# Patient Record
Sex: Female | Born: 2000 | Race: White | Hispanic: No | Smoking: Never smoker
Health system: Southern US, Community
[De-identification: ages and names within clinical notes are randomized; demographics above are authoritative.]

## PROBLEM LIST (undated history)

## (undated) DIAGNOSIS — J45909 Unspecified asthma, uncomplicated: Secondary | ICD-10-CM

## (undated) DIAGNOSIS — F909 Attention-deficit hyperactivity disorder, unspecified type: Secondary | ICD-10-CM

## (undated) DIAGNOSIS — R519 Headache, unspecified: Secondary | ICD-10-CM

## (undated) DIAGNOSIS — F429 Obsessive-compulsive disorder, unspecified: Secondary | ICD-10-CM

## (undated) DIAGNOSIS — H6980 Other specified disorders of Eustachian tube, unspecified ear: Secondary | ICD-10-CM

## (undated) DIAGNOSIS — F913 Oppositional defiant disorder: Secondary | ICD-10-CM

## (undated) DIAGNOSIS — F419 Anxiety disorder, unspecified: Secondary | ICD-10-CM

## (undated) HISTORY — DX: Obsessive-compulsive disorder, unspecified: F42.9

## (undated) HISTORY — DX: Attention-deficit hyperactivity disorder, unspecified type: F90.9

## (undated) HISTORY — DX: Oppositional defiant disorder: F91.3

## (undated) HISTORY — DX: Other specified disorders of Eustachian tube, unspecified ear: H69.80

## (undated) HISTORY — DX: Unspecified asthma, uncomplicated: J45.909

## (undated) HISTORY — PX: TYMPANOSTOMY TUBE PLACEMENT: SHX32

---
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
## 2022-01-03 ENCOUNTER — Ambulatory Visit (HOSPITAL_COMMUNITY): Admission: RE | Admit: 2022-01-03 | Payer: Medicaid Other | Source: Ambulatory Visit

## 2022-01-03 ENCOUNTER — Encounter: Payer: Medicaid Other | Admitting: Adult Health

## 2022-01-05 NOTE — Congregational Nurse Program (Signed)
?  Dept: [phone] ? ? ?Congregational Nurse Program Note ? ?Date of Encounter: 01/05/2022 ? ?Past Medical History: ?Past Medical History:  ?Diagnosis Date  ? ADHD (attention deficit hyperactivity disorder)   ? Anxiety   ? Eustachian tube dysfunction 11/11  ? Headache   ? OCD (obsessive compulsive disorder)   ? mild   ? ODD (oppositional defiant disorder)   ? some   ? ? ?Encounter Details: ?No complaints Stated she saw her  PCP and was told to watch her intake of sugar because her blood glucose  was elevated at the time of her visit. BP 120/80  P 78 Random blood sugar 118. Jenene Slicker RN ? ? ? ?

## 2022-01-07 ENCOUNTER — Encounter: Payer: Medicaid Other | Admitting: Obstetrics & Gynecology

## 2022-01-07 ENCOUNTER — Encounter: Payer: Medicaid Other | Admitting: Adult Health

## 2022-04-07 IMAGING — US US ABDOMEN LIMITED
1 series · 14 of 25 positions shown · non-contrast
Comparison: CT abdomen done on 06/21/2021

CLINICAL DATA: Pain right upper quadrant, nausea, vomiting

EXAM:
ULTRASOUND ABDOMEN LIMITED RIGHT UPPER QUADRANT

[Series 1: us abdomen limited · 14 of 75 slices shown]
[im 1/75]
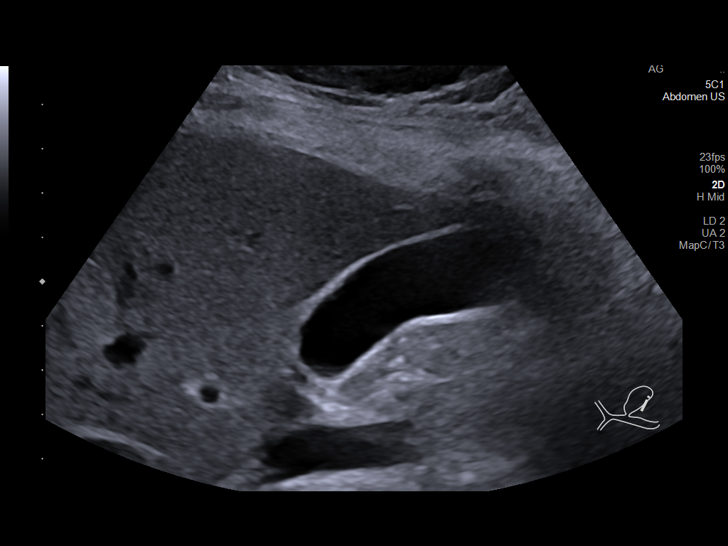
[im 7/75]
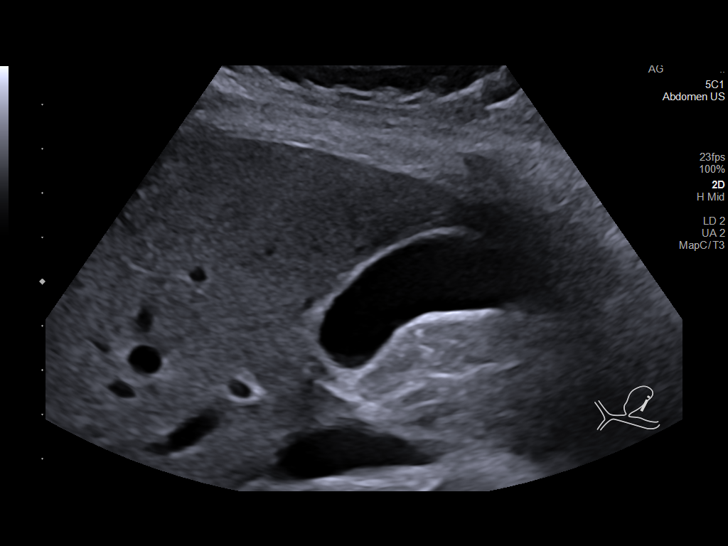
[im 13/75]
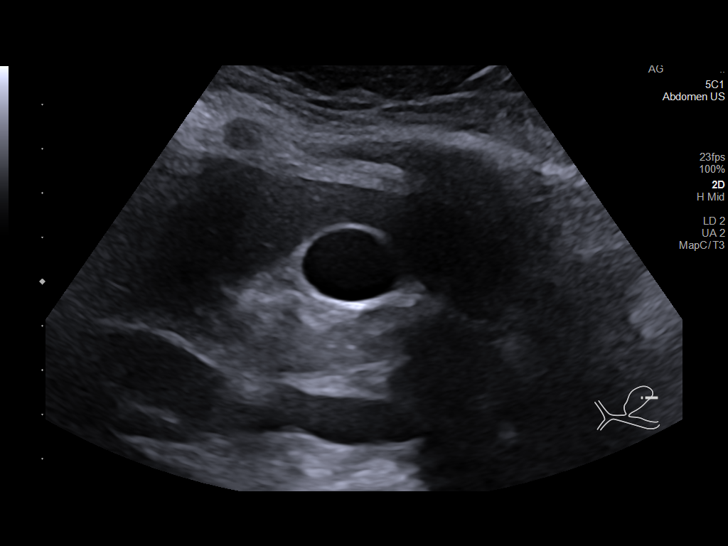
[im 19/75]
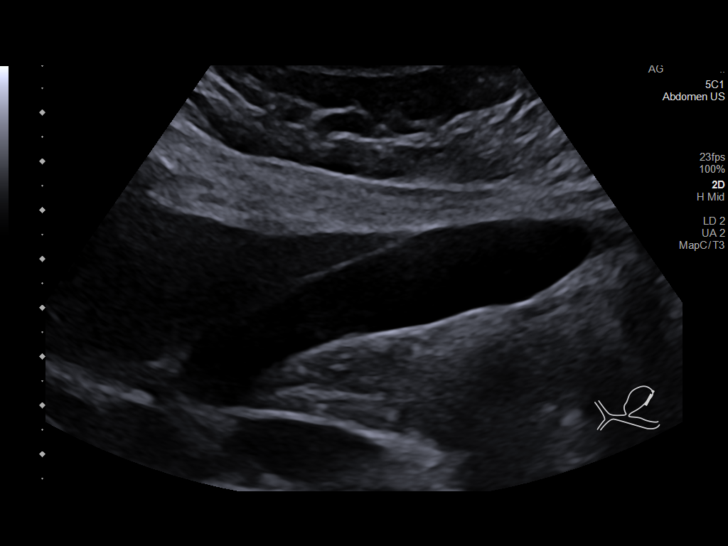
[im 25/75]
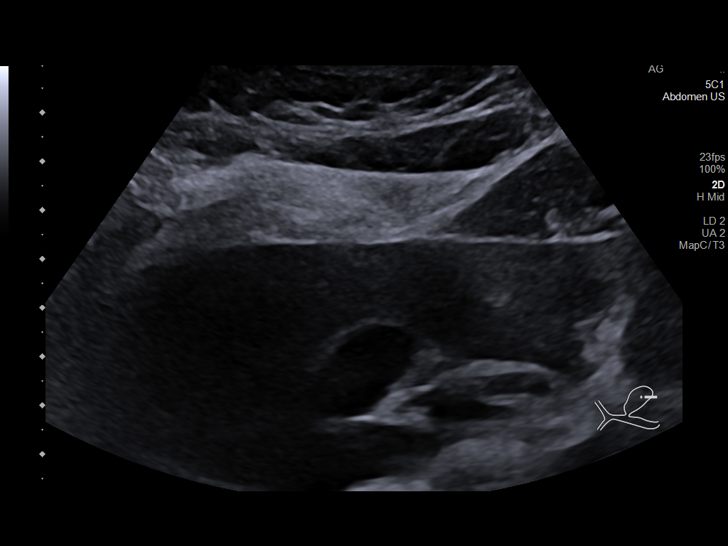
[im 28/75]
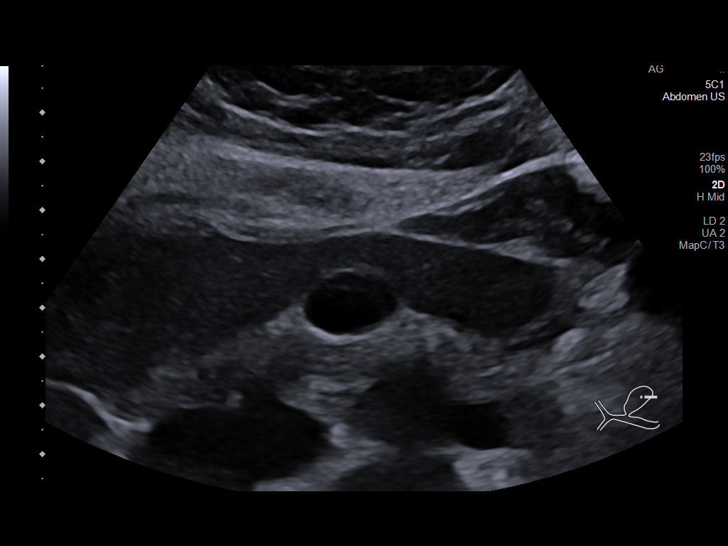
[im 34/75]
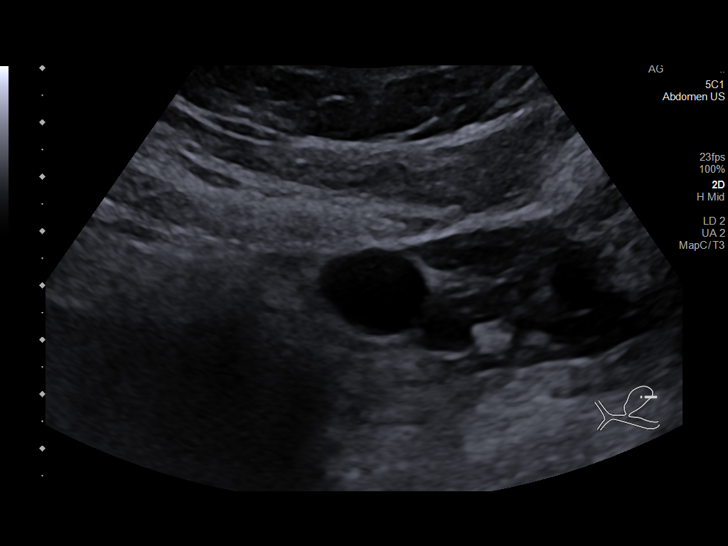
[im 41/75]
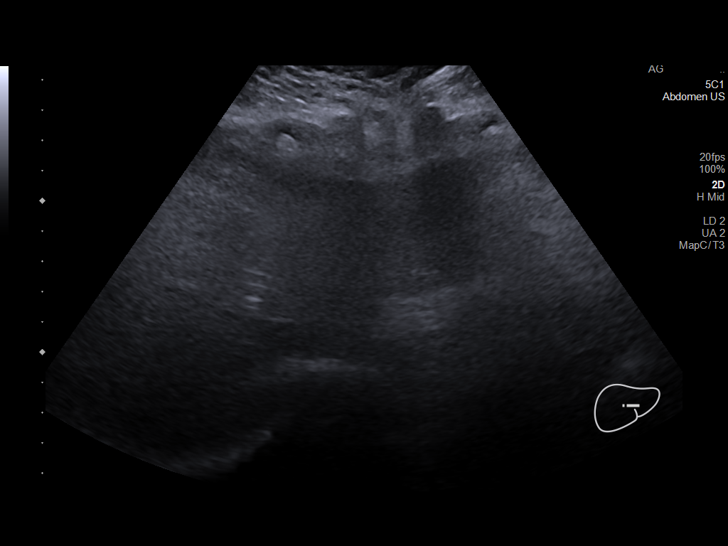
[im 47/75]
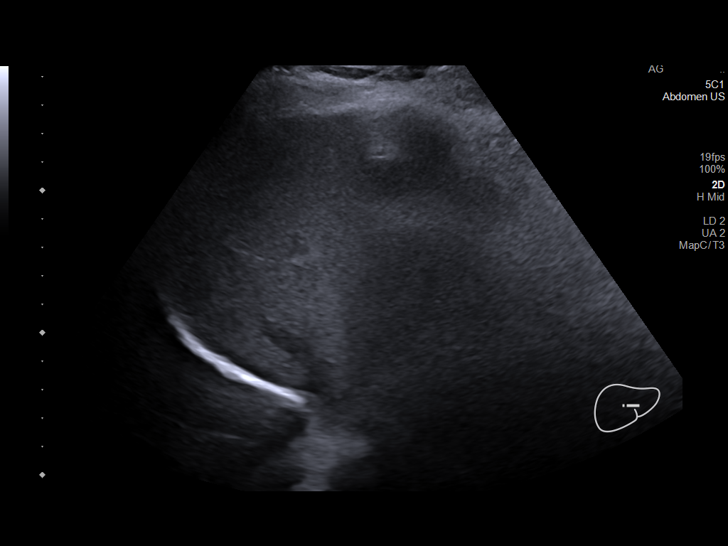
[im 50/75]
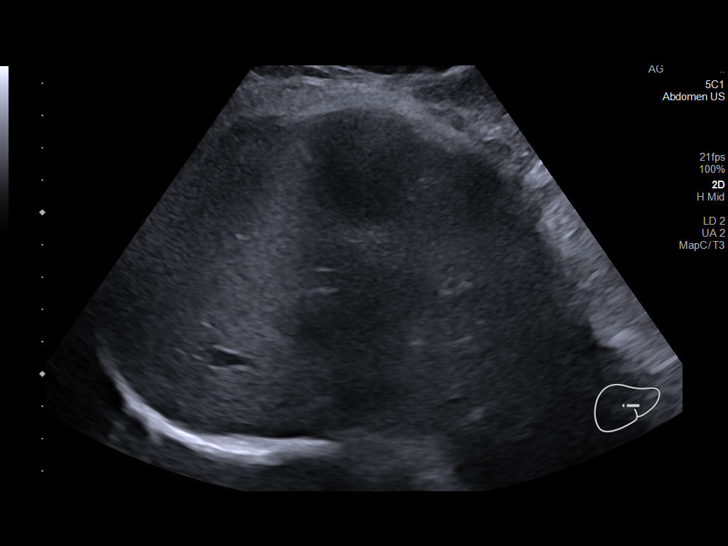
[im 56/75]
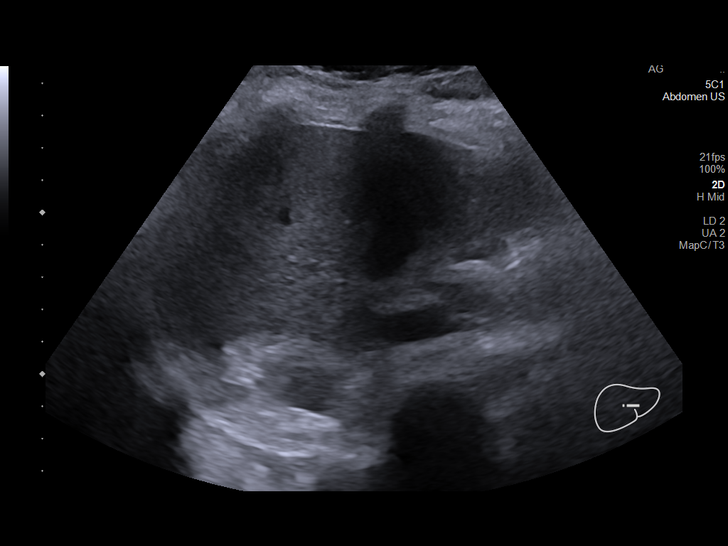
[im 62/75]
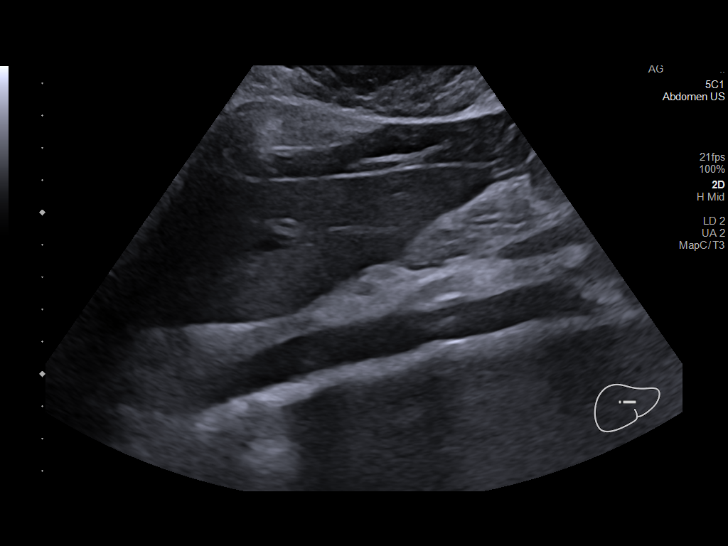
[im 68/75]
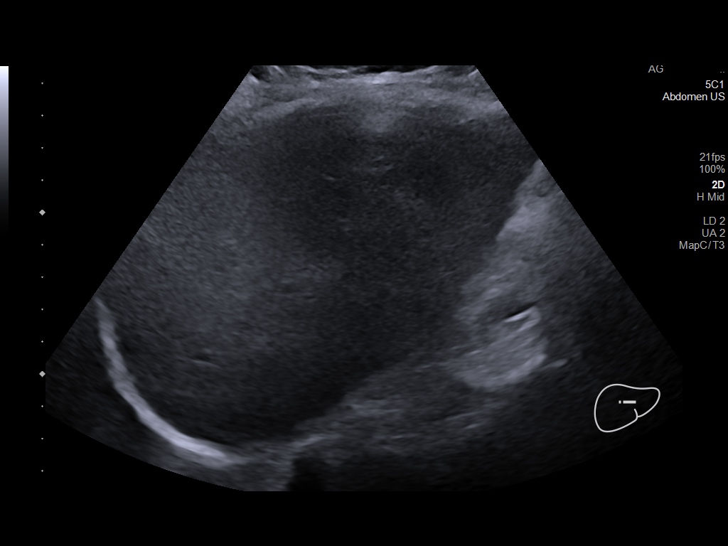
[im 75/75]
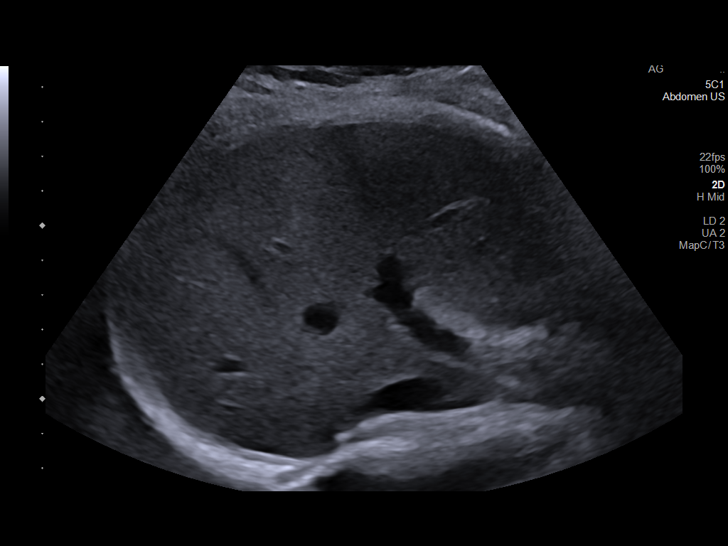

[14 of 25 positions shown; findings below may reference images not displayed]

FINDINGS: Gallbladder:

No gallstones or wall thickening visualized. No sonographic Murphy
sign noted by sonographer.

Common bile duct:

Diameter: 2 mm

Liver:

No focal lesion identified. Within normal limits in parenchymal
echogenicity. Portal vein is patent on color Doppler imaging with
normal direction of blood flow towards the liver.

Other: None.
IMPRESSION: No sonographic abnormality is seen in the right upper quadrant of
abdomen.

## 2022-04-10 ENCOUNTER — Ambulatory Visit (INDEPENDENT_AMBULATORY_CARE_PROVIDER_SITE_OTHER): Payer: Medicaid Other | Admitting: Nurse Practitioner

## 2022-04-10 ENCOUNTER — Encounter: Payer: Self-pay | Admitting: Nurse Practitioner

## 2022-04-10 VITALS — BP 129/88 | HR 97 | Temp 97.9°F | Resp 20 | Ht 68.0 in | Wt 147.0 lb

## 2022-04-10 DIAGNOSIS — F3481 Disruptive mood dysregulation disorder: Secondary | ICD-10-CM | POA: Diagnosis not present

## 2022-04-10 DIAGNOSIS — K219 Gastro-esophageal reflux disease without esophagitis: Secondary | ICD-10-CM | POA: Diagnosis not present

## 2022-04-10 DIAGNOSIS — N6452 Nipple discharge: Secondary | ICD-10-CM | POA: Diagnosis not present

## 2022-04-10 DIAGNOSIS — R7989 Other specified abnormal findings of blood chemistry: Secondary | ICD-10-CM

## 2022-04-10 DIAGNOSIS — Z3042 Encounter for surveillance of injectable contraceptive: Secondary | ICD-10-CM

## 2022-04-10 LAB — PREGNANCY, URINE: Preg Test, Ur: NEGATIVE

## 2022-04-10 MED ORDER — OMEPRAZOLE 20 MG PO CPDR: 20.0000 mg | DELAYED_RELEASE_CAPSULE | Freq: Every day | ORAL | 3 refills | Status: DC

## 2022-04-10 MED ORDER — MEDROXYPROGESTERONE ACETATE 150 MG/ML IM SUSP
150.0000 mg | Freq: Once | INTRAMUSCULAR | Status: AC
  Administered 2022-04-10: 150 mg via INTRAMUSCULAR

## 2022-04-10 NOTE — Addendum Note (Signed)
Addended by: Cleda Daub on: 04/10/2022 02:07 PM   Modules accepted: Orders

## 2022-04-10 NOTE — Patient Instructions (Signed)
Disruptive Mood Dysregulation Disorder, Pediatric Disruptive mood dysregulation disorder (DMDD) is a mental health disorder that affects children and adolescents who are 19-19 years of age. A child with this disorder regularly has severe temper outbursts that affect daily life. These outbursts are much more severe than what might be expected for the situation and the child's age. What are the causes? The cause of this condition is not known. What increases the risk? Your child may be more likely to develop this condition if he or she: Has a long-standing history of irritability or anger. Was previously diagnosed with bipolar disorder but did not meet all the criteria for a bipolar disorder diagnosis. Started having symptoms of irritability and temper outbursts before the age of 19. What are the signs or symptoms? Symptoms of this condition include: Severe temper outbursts that are extreme for the situation. Severe temper outbursts that happen three or more times a week for one year or longer. An angry or irritable mood between temper outbursts. An angry, sad, or irritable mood nearly every day. Trouble with daily living because of anger or irritability. How is this diagnosed? This condition may be diagnosed based on your child's symptoms. The health care provider will assess how severe the symptoms are and how long they have lasted. To be diagnosed with this condition, your child must have shown the symptoms for one year or longer. Your child may be referred to a child therapist to confirm the diagnosis and begin treatment. How is this treated? This condition may be treated with: Cognitive behavioral therapy. This is a form of talk therapy that can help your child learn coping skills to identify and regulate his or her moods and feelings. Medicines such as antidepressants, stimulants, or antipsychotics. Parent training to help you learn to manage your child's behavior at home and decrease  outbursts. Working with your Therapist, art or school. This may involve developing an educational plan to address behavioral and emotional challenges. Computer-based programs that help your child learn how to accurately read facial expressions. This skill can help prevent your child from misreading other people's faces in ways that can affect his or her mood. Dialectical Behavioral Training for children, (DBT-C) This form of therapy helps your child develop methods to identify intense arousal and practice self calming techniques. Your child's health care provider will create a treatment plan just for your child. Follow these instructions at home:  Keep a journal to track of your child's moods and outbursts and provide these records to your child's health care provider. Try to document what happens before, during, and after an outburst. Learn as much as you can about the disorder. Ask questions when you visit your child's health care provider. Learn about the risks and benefits of different treatments. Follow any parent training you receive. This may include ways to respond to irritable behavior and how to avoid or predict angry outbursts from your child. Contact a health care provider if: Your child's symptoms do not improve or they get worse. Your child's behavior is affecting daily life at home or at school. Get help right away if: Your child's outbursts may harm someone or your child. If you ever feel like your child may hurt himself or herself or others, or he or she shares thoughts about taking his or her own life, get help right away. You can go to your nearest emergency department or: Call your local emergency services (911 in the [place] Call a suicide crisis helpline, such as the [employer]  Suicide Prevention Lifeline at 8565922833 or 988 in the [place] This is open 24 hours a day in the [place] Text the Crisis Text Line at 256-476-2974 (in the [place] Summary A child with disruptive mood  dysregulation disorder (DMDD) regularly has severe temper outbursts that affect daily life. This condition affects children and adolescents who are 19-19 years of age. Symptoms of this disorder include temper outbursts on a regular basis and an angry or irritable mood between temper outbursts. Your child will only be diagnosed with this disorder if he or she has shown the symptoms for one year or longer. Treatment may include talk therapy (cognitive behavioral therapy), parent training, and antidepressant, stimulant, or antipsychotic medicines. This information is not intended to replace advice given to you by your health care provider. Make sure you discuss any questions you have with your health care provider. Document Revised: 03/27/2021 Document Reviewed: 01/13/2020 Elsevier Patient Education  2023 [employer].

## 2022-04-10 NOTE — Addendum Note (Signed)
Addended by: Cleda Daub on: 04/10/2022 02:36 PM   Modules accepted: Orders
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
## 2022-04-11 LAB — CMP14+EGFR
ALT: 29 IU/L (ref 0–32)
AST: 22 IU/L (ref 0–40)
Albumin/Globulin Ratio: 1.7 (ref 1.2–2.2)
Albumin: 4.7 g/dL (ref 4.0–5.0)
Alkaline Phosphatase: 96 IU/L (ref 42–106)
BUN/Creatinine Ratio: 25 — ABNORMAL HIGH (ref 9–23)
BUN: 14 mg/dL (ref 6–20)
Bilirubin Total: 0.4 mg/dL (ref 0.0–1.2)
CO2: 21 mmol/L (ref 20–29)
Calcium: 9.6 mg/dL (ref 8.7–10.2)
Chloride: 105 mmol/L (ref 96–106)
Creatinine, Ser: 0.56 mg/dL — ABNORMAL LOW (ref 0.57–1.00)
Globulin, Total: 2.8 g/dL (ref 1.5–4.5)
Glucose: 80 mg/dL (ref 70–99)
Potassium: 4 mmol/L (ref 3.5–5.2)
Sodium: 140 mmol/L (ref 134–144)
Total Protein: 7.5 g/dL (ref 6.0–8.5)
eGFR: 136 mL/min/{1.73_m2} (ref >59)

## 2022-04-11 LAB — CBC WITH DIFFERENTIAL/PLATELET
Basophils Absolute: 0 10*3/uL (ref 0.0–0.2)
Basos: 1 %
EOS (ABSOLUTE): 0.1 10*3/uL (ref 0.0–0.4)
Eos: 2 %
Hematocrit: 38.1 % (ref 34.0–46.6)
Hemoglobin: 12.8 g/dL (ref 11.1–15.9)
Immature Grans (Abs): 0 10*3/uL (ref 0.0–0.1)
Immature Granulocytes: 0 %
Lymphocytes Absolute: 1.3 10*3/uL (ref 0.7–3.1)
Lymphs: 24 %
MCH: 29.8 pg (ref 26.6–33.0)
MCHC: 33.6 g/dL (ref 31.5–35.7)
MCV: 89 fL (ref 79–97)
Monocytes Absolute: 0.6 10*3/uL (ref 0.1–0.9)
Monocytes: 12 %
Neutrophils Absolute: 3.2 10*3/uL (ref 1.4–7.0)
Neutrophils: 61 %
Platelets: 282 10*3/uL (ref 150–450)
RBC: 4.3 x10E6/uL (ref 3.77–5.28)
RDW: 13.1 % (ref 11.7–15.4)
WBC: 5.2 10*3/uL (ref 3.4–10.8)

## 2022-04-11 LAB — PROLACTIN: Prolactin: 53.1 ng/mL — ABNORMAL HIGH (ref 4.8–23.3)

## 2022-04-25 ENCOUNTER — Ambulatory Visit (HOSPITAL_COMMUNITY)
Admission: RE | Admit: 2022-04-25 | Discharge: 2022-04-25 | Disposition: A | Discharge status: Home / Self Care | Payer: Medicaid Other | Source: Ambulatory Visit | Attending: Nurse Practitioner | Admitting: Nurse Practitioner

## 2022-04-25 DIAGNOSIS — R7989 Other specified abnormal findings of blood chemistry: Secondary | ICD-10-CM | POA: Diagnosis present

## 2022-04-25 MED ORDER — GADOBUTROL 1 MMOL/ML IV SOLN
5.0000 mL | Freq: Once | INTRAVENOUS | Status: AC | PRN
  Administered 2022-04-25: 5 mL via INTRAVENOUS

## 2022-04-29 ENCOUNTER — Encounter: Payer: Self-pay | Admitting: Nurse Practitioner

## 2022-04-29 ENCOUNTER — Ambulatory Visit (INDEPENDENT_AMBULATORY_CARE_PROVIDER_SITE_OTHER): Payer: Medicaid Other | Admitting: Nurse Practitioner

## 2022-04-29 VITALS — BP 142/84 | HR 109 | Temp 98.1°F | Resp 20 | Ht 68.0 in | Wt 158.0 lb

## 2022-04-29 DIAGNOSIS — R300 Dysuria: Secondary | ICD-10-CM

## 2022-04-29 DIAGNOSIS — R7989 Other specified abnormal findings of blood chemistry: Secondary | ICD-10-CM | POA: Diagnosis not present

## 2022-04-29 NOTE — Addendum Note (Signed)
Addended by: Cleda Daub on: 04/29/2022 04:43 PM   Modules accepted: Orders

## 2022-04-29 NOTE — Addendum Note (Signed)
Addended by: Bennie Pierini on: 04/29/2022 04:41 PM   Modules accepted: Orders

## 2022-04-29 NOTE — Progress Notes (Signed)
   Subjective:    Patient ID: Pamela Norris, female    DOB: Mar 02, 2001, 21 y.o.   MRN: [id number]   Chief Complaint: Results   HPI Patient has been c/o bil breast leakage since February of this year. Her prolactin level has been elevated. We were finally able to schedule her for an MRI which showed no pituitary issues. She is on invega and this is a side effect of this medication. Is seeing neuro psych doctor in [place].      Review of Systems  Constitutional:  Negative for diaphoresis.  Eyes:  Negative for pain.  Respiratory:  Negative for shortness of breath.   Cardiovascular:  Negative for chest pain, palpitations and leg swelling.  Gastrointestinal:  Negative for abdominal pain.  Endocrine: Negative for polydipsia.  Skin:  Negative for rash.  Neurological:  Negative for dizziness, weakness and headaches.  Hematological:  Does not bruise/bleed easily.  All other systems reviewed and are negative.      Objective:   Physical Exam Constitutional:      Appearance: Normal appearance.  Cardiovascular:     Rate and Rhythm: Normal rate and regular rhythm.     Heart sounds: Normal heart sounds.  Pulmonary:     Breath sounds: Normal breath sounds.  Skin:    General: Skin is warm.  Neurological:     General: No focal deficit present.     Mental Status: She is alert.  Psychiatric:        Mood and Affect: Mood normal.        Behavior: Behavior normal.    BP (!) 142/84   Pulse (!) 109   Temp 98.1 F (36.7 C) (Temporal)   Resp 20   Ht 5\' 8"  (1.727 m)   Wt 158 lb (71.7 kg)   BMI 24.02 kg/m         Assessment & Plan:   Pamela Norris in today with chief complaint of Results   1. Elevated prolactin level Need to discuss with psych about prolactin level- this is side effect of invega.    The above assessment and management plan was discussed with the patient. The patient verbalized understanding of and has agreed to the management plan. Patient is aware  to call the clinic if symptoms persist or worsen. Patient is aware when to return to the clinic for a follow-up visit. Patient educated on when it is appropriate to go to the emergency department.   Mary-Margaret Dario Guardian, FNP

## 2022-04-30 LAB — URINALYSIS
Bilirubin, UA: NEGATIVE
Glucose, UA: NEGATIVE
Ketones, UA: NEGATIVE
Leukocytes,UA: NEGATIVE
Nitrite, UA: NEGATIVE
Protein,UA: NEGATIVE
Specific Gravity, UA: 1.02 (ref 1.005–1.030)
Urobilinogen, Ur: 0.2 mg/dL (ref 0.2–1.0)
pH, UA: 7 (ref 5.0–7.5)

## 2022-05-01 LAB — URINE CULTURE

## 2022-05-04 ENCOUNTER — Ambulatory Visit (HOSPITAL_COMMUNITY)
Admission: EM | Admit: 2022-05-04 | Discharge: 2022-05-04 | Discharge status: Against Medical Advice | Payer: Medicaid Other | Attending: Urology | Admitting: Urology

## 2022-05-04 DIAGNOSIS — Z20822 Contact with and (suspected) exposure to covid-19: Secondary | ICD-10-CM | POA: Diagnosis not present

## 2022-05-04 DIAGNOSIS — F419 Anxiety disorder, unspecified: Secondary | ICD-10-CM | POA: Diagnosis not present

## 2022-05-04 DIAGNOSIS — F3481 Disruptive mood dysregulation disorder: Secondary | ICD-10-CM | POA: Diagnosis not present

## 2022-05-04 DIAGNOSIS — F32A Depression, unspecified: Secondary | ICD-10-CM | POA: Insufficient documentation

## 2022-05-04 DIAGNOSIS — F909 Attention-deficit hyperactivity disorder, unspecified type: Secondary | ICD-10-CM | POA: Diagnosis not present

## 2022-05-04 DIAGNOSIS — Z9151 Personal history of suicidal behavior: Secondary | ICD-10-CM | POA: Insufficient documentation

## 2022-05-04 LAB — POCT URINE DRUG SCREEN - MANUAL ENTRY (I-SCREEN)
POC Amphetamine UR: NOT DETECTED
POC Buprenorphine (BUP): NOT DETECTED
POC Cocaine UR: NOT DETECTED
POC Marijuana UR: NOT DETECTED
POC Methadone UR: NOT DETECTED
POC Methamphetamine UR: NOT DETECTED
POC Morphine: NOT DETECTED
POC Oxazepam (BZO): NOT DETECTED
POC Oxycodone UR: NOT DETECTED
POC Secobarbital (BAR): NOT DETECTED

## 2022-05-04 LAB — POCT PREGNANCY, URINE: Preg Test, Ur: NEGATIVE

## 2022-05-04 LAB — POC SARS CORONAVIRUS 2 AG: SARSCOV2ONAVIRUS 2 AG: NEGATIVE

## 2022-05-04 MED ORDER — ALUM & MAG HYDROXIDE-SIMETH 200-200-20 MG/5ML PO SUSP: 30.0000 mL | ORAL | Status: DC | PRN

## 2022-05-04 MED ORDER — TRAZODONE HCL 50 MG PO TABS: 50.0000 mg | ORAL_TABLET | Freq: Every evening | ORAL | Status: DC | PRN

## 2022-05-04 MED ORDER — MAGNESIUM HYDROXIDE 400 MG/5ML PO SUSP: 30.0000 mL | Freq: Every day | ORAL | Status: DC | PRN

## 2022-05-04 MED ORDER — HYDROXYZINE HCL 25 MG PO TABS: 25.0000 mg | ORAL_TABLET | Freq: Three times a day (TID) | ORAL | Status: DC | PRN

## 2022-05-04 MED ORDER — ACETAMINOPHEN 325 MG PO TABS: 650.0000 mg | ORAL_TABLET | Freq: Four times a day (QID) | ORAL | Status: DC | PRN
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
## 2022-05-04 NOTE — ED Notes (Signed)
Pt combative with staff and security, as per NP Cecilio Asper, pt free to sign AMA form for discharge.  Pt signed form.  Left with Godfather.Marland Kitchen
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
## 2022-05-04 NOTE — ED Provider Notes (Signed)
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 Negative   Final   Comment 11/06/2021 Normal Reference Range Candida Galbrata - Negative   Final   Comment 11/06/2021 Normal Reference Range Trichomonas - Negative   Final   Comment 11/06/2021 Normal Reference Ranger Chlamydia - Negative   Final   Comment 11/06/2021 Normal Reference Range Neisseria Gonorrhea - Negative   Final  WBC 11/06/2021 8.5  3.4 - 10.8 x10E3/uL Final   RBC 11/06/2021 4.53  3.77 - 5.28 x10E6/uL Final   Hemoglobin 11/06/2021 12.9  11.1 - 15.9 g/dL Final   Hematocrit 11/06/2021 38.4   34.0 - 46.6 % Final   MCV 11/06/2021 85  79 - 97 fL Final   MCH 11/06/2021 28.5  26.6 - 33.0 pg Final   MCHC 11/06/2021 33.6  31.5 - 35.7 g/dL Final   RDW 11/06/2021 12.5  11.7 - 15.4 % Final   Platelets 11/06/2021 390  150 - 450 x10E3/uL Final   Neutrophils 11/06/2021 72  Not Estab. % Final   Lymphs 11/06/2021 18  Not Estab. % Final   Monocytes 11/06/2021 9  Not Estab. % Final   Eos 11/06/2021 1  Not Estab. % Final   Basos 11/06/2021 0  Not Estab. % Final   Neutrophils Absolute 11/06/2021 6.1  1.4 - 7.0 x10E3/uL Final   Lymphocytes Absolute 11/06/2021 1.5  0.7 - 3.1 x10E3/uL Final   Monocytes Absolute 11/06/2021 0.7  0.1 - 0.9 x10E3/uL Final   EOS (ABSOLUTE) 11/06/2021 0.0  0.0 - 0.4 x10E3/uL Final   Basophils Absolute 11/06/2021 0.0  0.0 - 0.2 x10E3/uL Final   Immature Granulocytes 11/06/2021 0  Not Estab. % Final   Immature Grans (Abs) 11/06/2021 0.0  0.0 - 0.1 x10E3/uL Final   Glucose 11/06/2021 97  70 - 99 mg/dL Final   BUN 11/06/2021 9  6 - 20 mg/dL Final   Creatinine, Ser 11/06/2021 0.55 (L)  0.57 - 1.00 mg/dL Final   eGFR 11/06/2021 136  >59 mL/min/1.73 Final   BUN/Creatinine Ratio 11/06/2021 16  9 - 23 Final   Sodium 11/06/2021 140  134 - 144 mmol/L Final   Potassium 11/06/2021 4.2  3.5 - 5.2 mmol/L Final   Chloride 11/06/2021 103  96 - 106 mmol/L Final   CO2 11/06/2021 20  20 - 29 mmol/L Final   Calcium 11/06/2021 10.0  8.7 - 10.2 mg/dL Final   Total Protein 11/06/2021 7.5  6.0 - 8.5 g/dL Final   Albumin 11/06/2021 4.7  3.9 - 5.0 g/dL Final   Globulin, Total 11/06/2021 2.8  1.5 - 4.5 g/dL Final   Albumin/Globulin Ratio 11/06/2021 1.7  1.2 - 2.2 Final   Bilirubin Total 11/06/2021 0.3  0.0 - 1.2 mg/dL Final   Alkaline Phosphatase 11/06/2021 103  42 - 106 IU/L Final   AST 11/06/2021 16  0 - 40 IU/L Final   ALT 11/06/2021 20  0 - 32 IU/L Final   Specimen Description 11/06/2021    Final                   Value:URINE, CLEAN CATCH Performed at [facility], [address] [place], [place] [address]    Special Requests 11/06/2021    Final                   Value:NONE Performed at [facility], [address], [place], [place] [address]    Culture 11/06/2021    Final                   Value:NO GROWTH Performed at [facility], [place] [address], [place], [place] [address]    Report Status 11/06/2021 11/08/2021 FINAL   Final    Allergies: Patient has no known allergies.  PTA Medications: (Not in a hospital admission)   Medical Decision Making  Patient initially recommended for continuous assessment however she  is declining and wants to leave [place].   Patient is contracting for safety and denies SI and HI. No evidence of imminent danger to self or others at this time. Patient does not meet criteria for psychiatric admission or IVC. Supportive therapy provided about ongoing stressors. Discussed crisis plan, callling [phone] or going to Emergency Dept     Recommendations  Based on my evaluation the patient does not appear to have an emergency medical condition.  Ophelia Shoulder, NP 05/05/22  1:59 AM

## 2022-05-07 ENCOUNTER — Encounter: Payer: Self-pay | Admitting: Emergency Medicine

## 2022-05-07 ENCOUNTER — Other Ambulatory Visit: Payer: Self-pay

## 2022-05-07 ENCOUNTER — Ambulatory Visit
Admission: EM | Admit: 2022-05-07 | Discharge: 2022-05-07 | Disposition: A | Discharge status: Home / Self Care | Payer: Medicaid Other | Attending: Nurse Practitioner | Admitting: Nurse Practitioner

## 2022-05-07 ENCOUNTER — Ambulatory Visit: Payer: Medicaid Other

## 2022-05-07 DIAGNOSIS — R519 Headache, unspecified: Secondary | ICD-10-CM | POA: Diagnosis not present

## 2022-05-07 DIAGNOSIS — R112 Nausea with vomiting, unspecified: Secondary | ICD-10-CM | POA: Diagnosis not present

## 2022-05-07 MED ORDER — KETOROLAC TROMETHAMINE 30 MG/ML IJ SOLN
30.0000 mg | Freq: Once | INTRAMUSCULAR | Status: AC
  Administered 2022-05-07: 30 mg via INTRAMUSCULAR

## 2022-05-07 MED ORDER — ONDANSETRON 4 MG PO TBDP: 4.0000 mg | ORAL_TABLET | Freq: Three times a day (TID) | ORAL | 0 refills | Status: DC | PRN

## 2022-05-07 NOTE — ED Triage Notes (Addendum)
Pt reports recently started olanzipine and reports headache, dizziness, and intermittent emesis for last several days.

## 2022-05-07 NOTE — Discharge Instructions (Addendum)
-   We have given you a shot of Toradol today to help with the headache.  Please do not take any more ibuprofen today.  You can use cool compresses, other supportive measures to help with the headache in the meantime. -I have sent Zofran to your pharmacy; take every 8 hours as needed for nausea or vomiting. -Your symptoms could either be a side effect from the medicine that you started on Monday or could be related to viral gastroenteritis.  Viral gastroenteritis will not last longer than 7 days or so. -Please increase hydration with plenty of water, sugar-free electrolyte drinks.  Try to eat foods that are easier to digest like applesauce, mashed potatoes, etc.  - Seek emergent care if your symptoms worsen and you develop severe nausea/vomiting and are unable to keep fluids down

## 2022-05-07 NOTE — ED Provider Notes (Signed)
[facility]    CSN: [id number] Arrival date & time: 05/07/22  1357      History   Chief Complaint Chief Complaint  Patient presents with   Headache    HPI Pamela Norris is a 21 y.o. female.   Patient presents today with headache, dizziness, and nausea/vomiting for the past couple of days.  Also endorses nonbloody diarrhea.  Reports the symptoms began shortly after starting olanzapine which she started 3 days ago.  Reports the nausea/vomiting is sudden and immediately before she has a little bit of abdominal pain.  The abdominal pain resolves after vomiting.  Reports mostly bilious vomiting, no blood in her vomit.  Denies any history of migraines.  Reports she does not drink very much water, usually drinks milk most of the day.  She has been eating and drinking normally since symptoms started per her report.    She denies any vision changes, chest pain, shortness of breath since the symptoms started.    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 No pertinent surgical history.  OB History     Gravida  0   Para  0   Term  0   Preterm  0   AB  0   Living  0      SAB  0   IAB  0   Ectopic  0   Multiple  0   Live Births  0            Home Medications    Prior to Admission medications   Medication Sig Start Date End Date Taking? Authorizing Provider  ondansetron (ZOFRAN-ODT) 4 MG disintegrating tablet Take 1 tablet (4 mg total) by mouth every 8 (eight) hours as needed for nausea or vomiting. 05/07/22  Yes Cathlean Marseilles A, NP  ALLERGY RELIEF 10 MG tablet Take 10 mg by mouth daily. 11/28/21   [provider]  atomoxetine (STRATTERA) 60 MG capsule Take 1 capsule (60 mg total) by mouth daily. 07/09/21   Daphine Deutscher, Mary-Margaret, FNP  dicyclomine (BENTYL) 20 MG tablet Take 1 tablet (20 mg total) by mouth 2 (two) times daily as needed for spasms. 12/11/21   Carroll Sage, PA-C  famotidine (PEPCID) 20 MG tablet Take 20 mg by mouth 2 (two) times daily. 10/24/21   [provider]  FLUoxetine (PROZAC) 20 MG capsule Take 20 mg by mouth daily. 02/17/22   [provider]  guanFACINE (INTUNIV) 4 MG TB24 ER tablet Take 1  tablet (4 mg total) by mouth at bedtime. 10/11/21   Mariel Craft, MD  hydrOXYzine (ATARAX) 10 MG tablet Take 10 mg by mouth 3 (three) times daily. 12/10/21   [provider]  lidocaine (XYLOCAINE) 2 % solution Use as directed 15 mLs in the mouth or throat every 6 (six) hours as needed for mouth pain. 12/05/21   [provider]  omeprazole (PRILOSEC) 20 MG capsule Take 1 capsule (20 mg total) by mouth daily. 04/10/22   Daphine Deutscher, Mary-Margaret, FNP  ondansetron (ZOFRAN) 4 MG tablet Take 1 tablet (4 mg total) by mouth every 6 (six) hours. 12/11/21   Carroll Sage, PA-C  paliperidone (INVEGA) 3 MG 24 hr tablet Take 1 tablet (3 mg total) by mouth at bedtime. 10/11/21   Mariel Craft, MD    Family History Family History  Adopted: Yes  Problem Relation Age of Onset   Alcohol abuse Mother    Drug abuse Mother    Alcohol abuse Father    Drug abuse Father     Social History Social History   Tobacco Use   Smoking status: Never   Smokeless tobacco: Never  Vaping Use   Vaping Use: Never used  Substance Use Topics   Alcohol use: Never   Drug use: Never     Allergies   Patient has no known allergies.   Review of Systems Review of Systems Per HPI  Physical Exam Triage Vital Signs ED Triage Vitals  Enc Vitals Group     BP 05/07/22 1429 124/79     Pulse Rate 05/07/22 1429 96     Resp 05/07/22 1429 20     Temp 05/07/22 1429 98.8 F (37.1 C)     Temp Source 05/07/22 1429 Oral     SpO2 05/07/22 1429 97 %     Weight --      Height --      Head Circumference --      Peak Flow --      Pain Score 05/07/22 1430 8     Pain Loc --      Pain Edu? --      Excl. in GC? --    No data found.  Updated Vital Signs BP 124/79 (BP Location: Right Arm)   Pulse 96   Temp 98.8 F (37.1 C) (Oral)   Resp 20   SpO2 97%   Visual Acuity Right Eye Distance:   Left Eye Distance:   Bilateral Distance:    Right Eye Near:   Left Eye Near:    Bilateral Near:     Physical Exam Vitals and nursing note reviewed.  Constitutional:      General: She is not in acute distress.    Appearance: Normal appearance. She is not toxic-appearing.  HENT:     Head: Normocephalic and atraumatic.     Nose: Nose normal. No congestion or rhinorrhea.     Mouth/Throat:     Mouth: Mucous membranes are moist.     Pharynx: Oropharynx is clear. No oropharyngeal exudate or posterior oropharyngeal erythema.  Eyes:     General: No scleral icterus.       Right eye: No discharge.        Left eye: No discharge.     Extraocular Movements: Extraocular movements intact.     Pupils: Pupils are equal, round, and reactive to light.  Cardiovascular:     Rate and Rhythm: Normal rate  and regular rhythm.  Pulmonary:  Effort: Pulmonary effort is normal. No respiratory distress.     Breath sounds: Normal breath sounds. No wheezing, rhonchi or rales.  Abdominal:     General: Abdomen is flat. Bowel sounds are normal. There is no distension.     Palpations: Abdomen is soft. There is no mass.     Tenderness: There is generalized abdominal tenderness. There is no right CVA tenderness, left CVA tenderness or guarding.  Musculoskeletal:        General: Normal range of motion.     Cervical back: Normal range of motion.     Right lower leg: No edema.     Left lower leg: No edema.  Lymphadenopathy:     Cervical: No cervical adenopathy.  Skin:    General: Skin is warm and dry.     Capillary Refill: Capillary refill takes less than 2 seconds.     Coloration: Skin is not jaundiced or pale.     Findings: No erythema.  Neurological:     Mental Status: She is alert and oriented to person, place, and time.     Motor: No weakness.     Gait: Gait normal.  Psychiatric:        Behavior: Behavior is cooperative.      UC Treatments / Results  Labs (all labs ordered are listed, but only abnormal results are displayed) Labs Reviewed - No data to display  EKG   Radiology No results found.  Procedures Procedures (including critical care time)  Medications Ordered in UC Medications  ketorolac (TORADOL) 30 MG/ML injection 30 mg (30 mg Intramuscular Given 05/07/22 1507)    Initial Impression / Assessment and Plan / UC Course  I have reviewed the triage vital signs and the nursing notes.  Pertinent labs & imaging results that were available during my care of the patient were reviewed by me and considered in my medical decision making (see chart for details).    \Patient is a very pleasant, well-appearing 21 year old female presenting for generalized abdominal pain, nausea/vomiting, and headache since Monday and starting a new medication.  In triage, she is normotensive,  not tachycardic, afebrile, not tachypneic, and oxygenating well on room air.  She appears to be in no acute distress.  We discussed differentials including viral gastroenteritis versus medication side effect.  Discussed that medication side effects typically wean the longer in individuals on the medication.  Also discussed that viral gastroenteritis symptoms last roughly 1 week and improve within that timeframe.  I encourage close follow-up with prescriber of olanzapine if symptoms or not improving.  Toradol 30 mg IM given today to help with headache.  Discouraged use of NSAID today, can resume NSAID use tomorrow.  Prescription given for Zofran 4 mg under the tongue every 8 hours as needed for nausea/vomiting.  ER precautions discussed.  Return precautions discussed.  The patient was given the opportunity to ask questions.  All questions answered to their satisfaction.  The patient is in agreement to this plan.   Final Clinical Impressions(s) / UC Diagnoses   Final diagnoses:  Nausea and vomiting, unspecified vomiting type  Acute nonintractable headache, unspecified headache type     Discharge Instructions      - We have given you a shot of Toradol today to help with the headache.  Please do not take any more ibuprofen today.  You can use cool compresses, other supportive measures to help with the headache in the meantime. -I have sent Zofran to your pharmacy; take every 8  hours as needed for nausea or vomiting. -Your symptoms could either be a side effect from the medicine that you started on Monday or could be related to viral gastroenteritis.  Viral gastroenteritis will not last longer than 7 days or so. -Please increase hydration with plenty of water, sugar-free electrolyte drinks.  Try to eat foods that are easier to digest like applesauce, mashed potatoes, etc.  - Seek emergent care if your symptoms worsen and you develop severe nausea/vomiting and are unable to keep fluids down    ED  Prescriptions     Medication Sig Dispense Auth. Provider   ondansetron (ZOFRAN-ODT) 4 MG disintegrating tablet Take 1 tablet (4 mg total) by mouth every 8 (eight) hours as needed for nausea or vomiting. 20 tablet Eulogio Bear, NP      PDMP not reviewed this encounter.   Eulogio Bear, NP 05/07/22 1723

## 2022-05-13 ENCOUNTER — Telehealth: Payer: Self-pay | Admitting: Nurse Practitioner

## 2022-05-13 NOTE — Telephone Encounter (Signed)
Left detailed message on patients voicemail that provider prefers that she be in the room alone during the exam. Advised that friend could wait in the waiting room if she would like. Advised to contact office with any further questions.

## 2022-05-22 ENCOUNTER — Encounter: Payer: Medicaid Other | Admitting: Nurse Practitioner

## 2022-05-22 NOTE — Progress Notes (Deleted)
   Subjective:    Patient ID: Pamela Norris, female    DOB: 05-16-2001, 21 y.o.   MRN: [id number]   Chief Complaint: high risk sexual behavior  HPI Patient has had multiple sex partners, she claims when she was living in a group home. She denies any vaginal discharge. She just wants to be checked to make sure she is okay.    Review of Systems  Constitutional:  Negative for diaphoresis.  Eyes:  Negative for pain.  Respiratory:  Negative for shortness of breath.   Cardiovascular:  Negative for chest pain, palpitations and leg swelling.  Gastrointestinal:  Negative for abdominal pain.  Endocrine: Negative for polydipsia.  Skin:  Negative for rash.  Neurological:  Negative for dizziness, weakness and headaches.  Hematological:  Does not bruise/bleed easily.  All other systems reviewed and are negative.      Objective:   Physical Exam Vitals and nursing note reviewed.  Constitutional:      General: She is not in acute distress.    Appearance: Normal appearance. She is well-developed.  HENT:     Head: Normocephalic.     Right Ear: Tympanic membrane normal.     Left Ear: Tympanic membrane normal.     Nose: Nose normal.     Mouth/Throat:     Mouth: Mucous membranes are moist.  Eyes:     Pupils: Pupils are equal, round, and reactive to light.  Neck:     Vascular: No carotid bruit or JVD.  Cardiovascular:     Rate and Rhythm: Normal rate and regular rhythm.     Heart sounds: Normal heart sounds.  Pulmonary:     Effort: Pulmonary effort is normal. No respiratory distress.     Breath sounds: Normal breath sounds. No wheezing or rales.  Chest:     Chest wall: No tenderness.  Abdominal:     General: Bowel sounds are normal. There is no distension or abdominal bruit.     Palpations: Abdomen is soft. There is no hepatomegaly, splenomegaly, mass or pulsatile mass.     Tenderness: There is no abdominal tenderness.  Genitourinary:    General: Normal vulva.     Vagina: No  vaginal discharge.     Rectum: Normal.     Comments: Cervix nonparous and pink No adnexal masses to tenderness Musculoskeletal:        General: Normal range of motion.     Cervical back: Normal range of motion and neck supple.  Lymphadenopathy:     Cervical: No cervical adenopathy.  Skin:    General: Skin is warm and dry.  Neurological:     Mental Status: She is alert and oriented to person, place, and time.     Deep Tendon Reflexes: Reflexes are normal and symmetric.  Psychiatric:        Behavior: Behavior normal.        Thought Content: Thought content normal.        Judgment: Judgment normal.            Assessment & Plan:

## 2022-05-23 ENCOUNTER — Encounter: Payer: Self-pay | Admitting: Nurse Practitioner

## 2022-06-13 ENCOUNTER — Encounter: Payer: Medicaid Other | Admitting: Nurse Practitioner

## 2022-06-19 ENCOUNTER — Encounter (HOSPITAL_COMMUNITY): Payer: Self-pay | Admitting: Emergency Medicine

## 2022-06-19 ENCOUNTER — Telehealth: Payer: Self-pay

## 2022-06-19 ENCOUNTER — Emergency Department (HOSPITAL_COMMUNITY)
Admission: EM | Admit: 2022-06-19 | Discharge: 2022-06-19 | Disposition: A | Discharge status: Home / Self Care | Payer: Medicaid Other | Attending: Emergency Medicine | Admitting: Emergency Medicine

## 2022-06-19 DIAGNOSIS — Z5900 Homelessness unspecified: Secondary | ICD-10-CM | POA: Diagnosis present

## 2022-06-19 DIAGNOSIS — Z711 Person with feared health complaint in whom no diagnosis is made: Secondary | ICD-10-CM | POA: Diagnosis not present

## 2022-06-19 NOTE — Telephone Encounter (Signed)
Transition Care Management Unsuccessful Follow-up Telephone Call  Date of discharge and from where:  06/18/2022 [facility] ED  Attempts:  1st Attempt  Reason for unsuccessful TCM follow-up call:  No answer/busy

## 2022-06-19 NOTE — ED Triage Notes (Signed)
Pt arrives tonight to ED due to being homeless and having no where to sleep tonight. Pt states her mom wanted her to be seen due to her talking in her sleep.

## 2022-06-19 NOTE — ED Provider Notes (Signed)
[facility]PART[facility] Provider Note   CSN: [id number] Arrival date & time: 06/19/22  0009     History  Chief Complaint  Patient presents with   Homeless    Pamela GuardianJessica M Norris is a 21 y.o. female.  Patient presents because she has nowhere to sleep tonight.  She reports that her father will not let her into the house.  She is angry at her father but does not have any homicidal plans.  She denies suicidality.       Home Medications Prior to Admission medications   Medication Sig Start Date End Date Taking? Authorizing Provider  ALLERGY RELIEF 10 MG tablet Take 10 mg by mouth daily. 11/28/21   [provider]  atomoxetine (STRATTERA) 60 MG capsule Take 1 capsule (60 mg total) by mouth daily. 07/09/21   Daphine DeutscherMartin, Mary-Margaret, FNP  dicyclomine (BENTYL) 20 MG tablet Take 1 tablet (20 mg total) by mouth 2 (two) times daily as needed for spasms. 12/11/21   Carroll SageFaulkner, William J, PA-C  famotidine (PEPCID) 20 MG tablet Take 20 mg by mouth 2 (two) times daily. 10/24/21   [provider]  FLUoxetine (PROZAC) 20 MG capsule Take 20 mg by mouth daily. 02/17/22   [provider]  guanFACINE (INTUNIV) 4 MG TB24 ER tablet Take 1 tablet (4 mg total) by mouth at bedtime. 10/11/21   Mariel CraftMaurer, Sheila M, MD  hydrOXYzine (ATARAX) 10 MG tablet Take 10 mg by mouth 3 (three) times daily. 12/10/21   [provider]  lidocaine (XYLOCAINE) 2 % solution Use as directed 15 mLs in the mouth or throat every 6 (six) hours as needed for mouth pain. 12/05/21   [provider]  omeprazole (PRILOSEC) 20 MG capsule Take 1 capsule (20 mg total) by mouth daily. 04/10/22   Daphine DeutscherMartin, Mary-Margaret, FNP  ondansetron (ZOFRAN) 4 MG tablet Take 1 tablet (4 mg total) by mouth every 6 (six) hours. 12/11/21   Carroll SageFaulkner, William J, PA-C  ondansetron (ZOFRAN-ODT) 4 MG disintegrating tablet Take 1 tablet (4 mg total) by mouth every 8 (eight) hours as needed for nausea or vomiting. 05/07/22    Valentino NoseMartinez, Jenavee A, NP  paliperidone (INVEGA) 3 MG 24 hr tablet Take 1 tablet (3 mg total) by mouth at bedtime. 10/11/21   Mariel CraftMaurer, Sheila M, MD      Allergies    Patient has no known allergies.    Review of Systems   Review of Systems  Physical Exam Updated Vital Signs BP 137/80 (BP Location: Right Arm)   Pulse 98   Temp 98.1 F (36.7 C) (Oral)   Resp 16   Ht 5\' 8"  (1.727 m)   Wt 71.7 kg   SpO2 97%   BMI 24.03 kg/m  Physical Exam Vitals and nursing note reviewed.  Constitutional:      General: She is not in acute distress.    Appearance: She is well-developed.  HENT:     Head: Normocephalic and atraumatic.     Mouth/Throat:     Mouth: Mucous membranes are moist.  Eyes:     General: Vision grossly intact. Gaze aligned appropriately.     Extraocular Movements: Extraocular movements intact.     Conjunctiva/sclera: Conjunctivae normal.  Cardiovascular:     Rate and Rhythm: Normal rate and regular rhythm.     Pulses: Normal pulses.     Heart sounds: Normal heart sounds, S1 normal and S2 normal. No murmur heard.    No friction rub. No gallop.  Pulmonary:  Effort: Pulmonary effort is normal. No respiratory distress.     Breath sounds: Normal breath sounds.  Abdominal:     General: Bowel sounds are normal.     Palpations: Abdomen is soft.     Tenderness: There is no abdominal tenderness. There is no guarding or rebound.     Hernia: No hernia is present.  Musculoskeletal:        General: No swelling.     Cervical back: Full passive range of motion without pain, normal range of motion and neck supple. No spinous process tenderness or muscular tenderness. Normal range of motion.     Right lower leg: No edema.     Left lower leg: No edema.  Skin:    General: Skin is warm and dry.     Capillary Refill: Capillary refill takes less than 2 seconds.     Findings: No ecchymosis, erythema, rash or wound.  Neurological:     General: No focal deficit present.     Mental  Status: She is alert and oriented to person, place, and time.     GCS: GCS eye subscore is 4. GCS verbal subscore is 5. GCS motor subscore is 6.     Cranial Nerves: Cranial nerves 2-12 are intact.     Sensory: Sensation is intact.     Motor: Motor function is intact.     Coordination: Coordination is intact.  Psychiatric:        Attention and Perception: Attention normal.        Mood and Affect: Mood normal.        Speech: Speech normal.        Behavior: Behavior normal.     ED Results / Procedures / Treatments   Labs (all labs ordered are listed, but only abnormal results are displayed) Labs Reviewed - No data to display  EKG None  Radiology No results found.  Procedures Procedures    Medications Ordered in ED Medications - No data to display  ED Course/ Medical Decision Making/ A&P                           Medical Decision Making  Presents secondary to homelessness.  She has nowhere to sleep tonight.  We will give her a place to stay for the night, recheck her in the morning.        Final Clinical Impression(s) / ED Diagnoses Final diagnoses:  Homelessness    Rx / DC Orders ED Discharge Orders     None         Aymar Whitfill, Gwenyth Allegra, MD 06/19/22 [id number]

## 2022-06-20 ENCOUNTER — Telehealth: Payer: Self-pay | Admitting: Nurse Practitioner

## 2022-06-20 NOTE — Telephone Encounter (Signed)
Lmtcb.

## 2022-06-20 NOTE — Telephone Encounter (Signed)
Transition Care Management Unsuccessful Follow-up Telephone Call  Date of discharge and from where:  06/18/2022 [facility] and 06/19/2022 [facility]  Attempts:  2nd Attempt  Reason for unsuccessful TCM follow-up call:  Unable to reach patient - letter mailed

## 2022-06-25 NOTE — Telephone Encounter (Signed)
Lmtcb no return calll will close encounter

## 2022-06-27 ENCOUNTER — Emergency Department (HOSPITAL_COMMUNITY)
Admission: EM | Admit: 2022-06-27 | Discharge: 2022-06-27 | Disposition: A | Discharge status: Home / Self Care | Payer: Medicaid Other | Attending: Emergency Medicine | Admitting: Emergency Medicine

## 2022-06-27 ENCOUNTER — Encounter (HOSPITAL_COMMUNITY): Payer: Self-pay

## 2022-06-27 ENCOUNTER — Other Ambulatory Visit: Payer: Self-pay

## 2022-06-27 DIAGNOSIS — T520X1A Toxic effect of petroleum products, accidental (unintentional), initial encounter: Secondary | ICD-10-CM | POA: Insufficient documentation

## 2022-06-27 DIAGNOSIS — T6591XA Toxic effect of unspecified substance, accidental (unintentional), initial encounter: Secondary | ICD-10-CM

## 2022-06-27 LAB — COMPREHENSIVE METABOLIC PANEL
ALT: 62 U/L — ABNORMAL HIGH (ref 0–44)
AST: 35 U/L (ref 15–41)
Albumin: 4.2 g/dL (ref 3.5–5.0)
Alkaline Phosphatase: 115 U/L (ref 38–126)
Anion gap: 8 (ref 5–15)
BUN: 13 mg/dL (ref 6–20)
CO2: 21 mmol/L — ABNORMAL LOW (ref 22–32)
Calcium: 9.4 mg/dL (ref 8.9–10.3)
Chloride: 109 mmol/L (ref 98–111)
Creatinine, Ser: 0.31 mg/dL — ABNORMAL LOW (ref 0.44–1.00)
GFR, Estimated: >60 mL/min (ref >60)
Glucose, Bld: 112 mg/dL — ABNORMAL HIGH (ref 70–99)
Potassium: 4 mmol/L (ref 3.5–5.1)
Sodium: 138 mmol/L (ref 135–145)
Total Bilirubin: 0.5 mg/dL (ref 0.3–1.2)
Total Protein: 7.8 g/dL (ref 6.5–8.1)

## 2022-06-27 LAB — RAPID URINE DRUG SCREEN, HOSP PERFORMED
Amphetamines: NOT DETECTED
Barbiturates: NOT DETECTED
Benzodiazepines: NOT DETECTED
Cocaine: NOT DETECTED
Opiates: NOT DETECTED
Tetrahydrocannabinol: NOT DETECTED

## 2022-06-27 LAB — POC URINE PREG, ED: Preg Test, Ur: NEGATIVE

## 2022-06-27 LAB — URINALYSIS, ROUTINE W REFLEX MICROSCOPIC
Bilirubin Urine: NEGATIVE
Glucose, UA: NEGATIVE mg/dL
Hgb urine dipstick: NEGATIVE
Ketones, ur: NEGATIVE mg/dL
Leukocytes,Ua: NEGATIVE
Nitrite: NEGATIVE
Protein, ur: 30 mg/dL — AB
Specific Gravity, Urine: 1.03 (ref 1.005–1.030)
pH: 5 (ref 5.0–8.0)

## 2022-06-27 LAB — CBC
HCT: 39.1 % (ref 36.0–46.0)
Hemoglobin: 13.3 g/dL (ref 12.0–15.0)
MCH: 29.4 pg (ref 26.0–34.0)
MCHC: 34 g/dL (ref 30.0–36.0)
MCV: 86.5 fL (ref 80.0–100.0)
Platelets: 477 10*3/uL — ABNORMAL HIGH (ref 150–400)
RBC: 4.52 MIL/uL (ref 3.87–5.11)
RDW: 12.7 % (ref 11.5–15.5)
WBC: 9.1 10*3/uL (ref 4.0–10.5)
nRBC: 0 % (ref 0.0–0.2)

## 2022-06-27 LAB — ETHANOL: Alcohol, Ethyl (B): <10 mg/dL (ref <10)

## 2022-06-27 LAB — MAGNESIUM: Magnesium: 2.2 mg/dL (ref 1.7–2.4)

## 2022-06-27 LAB — ACETAMINOPHEN LEVEL: Acetaminophen (Tylenol), Serum: <10 ug/mL — ABNORMAL LOW (ref 10–30)

## 2022-06-27 LAB — SALICYLATE LEVEL: Salicylate Lvl: <7 mg/dL — ABNORMAL LOW (ref 7.0–30.0)

## 2022-06-27 NOTE — ED Notes (Signed)
Pt denies suicidal ideation and states that she drank the gas and oil in an attempt to make her friend feel bad for her and allow her to stay in his truck. When mentioning this, pt became angry and stated that her friend only called the police to make her get out of the truck and not because he was concerned for her safety.

## 2022-06-27 NOTE — ED Notes (Signed)
[place] for pt to eat and drink per Santiago Glad, NP

## 2022-06-27 NOTE — ED Triage Notes (Signed)
Pt IVC by [employer] for SI. Per police pt told [employer] that she wanted to kill herself.   Pt reports she ingested oil and ingested gasoline today after becoming upset at a friend. Pt reports the friend told her to get out of his truck and she refused. Pt then begin drinking the motor oil and gasoline.
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
## 2022-06-27 NOTE — ED Notes (Signed)
Pt given cup of water and rinsed mouth out with water and spit in cup
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
## 2022-06-27 NOTE — ED Notes (Addendum)
Pt given beverage and frozen dinner tray

## 2022-06-27 NOTE — ED Notes (Signed)
RN from poison control called to get update on pt- informed that pt was eating drinking and talking on cell phone, in no distress-  recommends 6 hour obs and then Cxray per poison control, Zelphia Cairo, RN who says Cxray is recommended at provider's discretion. Santiago Glad, [place] made aware.
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
## 2022-06-27 NOTE — ED Notes (Addendum)
Called poison control and they stated to get an EKG and lab work including: salicylate, CMP, CBC, acetaminophen, magnesium, ETOH; they stated pt should be on cardiac monitor and pt should rinse mouth out with water and do not drink anything for the next 2 hours;   Pt is at risk for pneumonitis and pulmonary edema; monitor pt for coughing, rapid breathing and drowsiness  They stated when pt belches or has diarrhea it may smell like gas fumes  Poison control stated they would call back in 1.5 hours and get an update and at the 6 hour observation pt should have a chest xray to check for pulmonary aspiration

## 2022-06-27 NOTE — ED Notes (Signed)
Pamela Norris brought bag for pt with box of tampons, pack of underwear and [employer] debit card with name [name] on it. This bag is being placed in [facility] locker, locked and labeled with pt name

## 2022-06-30 ENCOUNTER — Encounter (HOSPITAL_COMMUNITY): Payer: Self-pay

## 2022-06-30 ENCOUNTER — Telehealth: Payer: Self-pay

## 2022-06-30 ENCOUNTER — Emergency Department (HOSPITAL_COMMUNITY)
Admission: EM | Admit: 2022-06-30 | Discharge: 2022-07-01 | Discharge status: Against Medical Advice | Payer: Medicaid Other | Attending: Emergency Medicine | Admitting: Emergency Medicine

## 2022-06-30 ENCOUNTER — Other Ambulatory Visit: Payer: Self-pay

## 2022-06-30 DIAGNOSIS — M549 Dorsalgia, unspecified: Secondary | ICD-10-CM | POA: Diagnosis present

## 2022-06-30 DIAGNOSIS — M7989 Other specified soft tissue disorders: Secondary | ICD-10-CM | POA: Diagnosis not present

## 2022-06-30 DIAGNOSIS — Z5321 Procedure and treatment not carried out due to patient leaving prior to being seen by health care provider: Secondary | ICD-10-CM | POA: Diagnosis not present

## 2022-06-30 LAB — BASIC METABOLIC PANEL
Anion gap: 8 (ref 5–15)
BUN: 13 mg/dL (ref 6–20)
CO2: 20 mmol/L — ABNORMAL LOW (ref 22–32)
Calcium: 9 mg/dL (ref 8.9–10.3)
Chloride: 109 mmol/L (ref 98–111)
Creatinine, Ser: 0.68 mg/dL (ref 0.44–1.00)
GFR, Estimated: >60 mL/min (ref >60)
Glucose, Bld: 102 mg/dL — ABNORMAL HIGH (ref 70–99)
Potassium: 3.8 mmol/L (ref 3.5–5.1)
Sodium: 137 mmol/L (ref 135–145)

## 2022-06-30 LAB — CBC
HCT: 36.7 % (ref 36.0–46.0)
Hemoglobin: 12.1 g/dL (ref 12.0–15.0)
MCH: 28.9 pg (ref 26.0–34.0)
MCHC: 33 g/dL (ref 30.0–36.0)
MCV: 87.6 fL (ref 80.0–100.0)
Platelets: 419 10*3/uL — ABNORMAL HIGH (ref 150–400)
RBC: 4.19 MIL/uL (ref 3.87–5.11)
RDW: 12.1 % (ref 11.5–15.5)
WBC: 8.1 10*3/uL (ref 4.0–10.5)
nRBC: 0 % (ref 0.0–0.2)

## 2022-06-30 NOTE — ED Notes (Signed)
Pt called for triage with no answer 

## 2022-06-30 NOTE — ED Triage Notes (Signed)
Pt arrived from home via [facility] w multiple complaints. Pt states that her feet are swollen and 10/10 pain. Pt has back pain 10/10 and would like all SI checks done per her current partner she needs to be checked for everything.

## 2022-06-30 NOTE — Telephone Encounter (Signed)
Transition Care Management Unsuccessful Follow-up Telephone Call  Date of discharge and from where:  06/27/2022  Attempts:  1st Attempt  Reason for unsuccessful TCM follow-up call:  Left voice message

## 2022-06-30 NOTE — Telephone Encounter (Signed)
Transition Care Management Unsuccessful Follow-up Telephone Call  Date of discharge and from where:  06/27/22 [facility] and [facility]   Attempts:  3rd Attempt  Reason for unsuccessful TCM follow-up call:  Unable to reach patient - letter mailed

## 2022-06-30 NOTE — Telephone Encounter (Signed)
Transition Care Management Unsuccessful Follow-up Telephone Call  Date of discharge and from where:  06/27/2022 pt seen at [facility]   Attempts:  2nd Attempt  Reason for unsuccessful TCM follow-up call:  Unable to reach patient - left massage with mother to have patient call back

## 2022-06-30 NOTE — ED Notes (Signed)
Pt called to triage with no response x2 

## 2022-07-01 ENCOUNTER — Encounter (HOSPITAL_COMMUNITY): Payer: Self-pay | Admitting: Emergency Medicine

## 2022-07-01 ENCOUNTER — Emergency Department (HOSPITAL_COMMUNITY)
Admission: EM | Admit: 2022-07-01 | Discharge: 2022-07-01 | Disposition: A | Discharge status: Home / Self Care | Payer: Medicaid Other | Source: Home / Self Care | Attending: Emergency Medicine | Admitting: Emergency Medicine

## 2022-07-01 DIAGNOSIS — M7989 Other specified soft tissue disorders: Secondary | ICD-10-CM | POA: Insufficient documentation

## 2022-07-01 NOTE — Discharge Instructions (Signed)
You were evaluated in the Emergency Department and after careful evaluation, we did not find any emergent condition requiring admission or further testing in the hospital.  Your exam/testing today was overall reassuring.  Recommend elevating your legs as often as you can during the day, use compression socks as we discussed.  Please return to the Emergency Department if you experience any worsening of your condition.  Thank you for allowing [place] to be a part of your care.

## 2022-07-01 NOTE — ED Provider Notes (Signed)
[facility]-EMERGENCY DEPT [facility] Emergency Department Provider Note MRN:  [id number]  Arrival date & time: 07/01/22     Chief Complaint   Multiple Complaints   History of Present Illness   Pamela Norris is a 21 y.o. year-old female with a history of ADHD presenting to the ED with chief complaint of leg swelling.  Swelling to both legs over the past few days.  No trauma, no pain.  Walks a lot.  No other complaints at this time.  Review of Systems  A thorough review of systems was obtained and all systems are negative except as noted in the HPI and PMH.   Patient's Health History    Past Medical History:  Diagnosis Date   ADHD (attention deficit hyperactivity disorder)    Anxiety    Eustachian tube dysfunction 11/11   Headache    OCD (obsessive compulsive disorder)    mild    ODD (oppositional defiant disorder)    some     History reviewed. No pertinent surgical history.  Family History  Adopted: Yes  Problem Relation Age of Onset   Alcohol abuse Mother    Drug abuse Mother    Alcohol abuse Father    Drug abuse Father     Social History   Socioeconomic History   Marital status: Single    Spouse name: Not on file   Number of children: 0   Years of education: Not on file   Highest education level: 9th grade  Occupational History   Not on file  Tobacco Use   Smoking status: Never   Smokeless tobacco: Never  Vaping Use   Vaping Use: Never used  Substance and Sexual Activity   Alcohol use: Never   Drug use: Never   Sexual activity: Never    Birth control/protection: Injection  Other Topics Concern   Not on file  Social History Narrative   Pamela Norris is a 9th grade student.   She lives with her adoptive parents. She has five siblings.   She enjoys making jewelry,  and giving her dogs baths.   Social Determinants of Health   Financial Resource Strain: Not on file  Food Insecurity: Not on file  Transportation Needs: Not on file  Physical Activity: Not  on file  Stress: Not on file  Social Connections: Not on file  Intimate Partner Violence: Not on file     Physical Exam   Vitals:   07/01/22 0200  BP: (!) 142/75  Pulse: 97  Resp: 16  Temp: 98 F (36.7 C)  SpO2: 97%    CONSTITUTIONAL: Well-appearing, NAD NEURO/PSYCH:  Alert and oriented x 3, no focal deficits EYES:  eyes equal and reactive ENT/NECK:  no LAD, no JVD CARDIO: Regular rate, well-perfused, normal S1 and S2 PULM:  CTAB no wheezing or rhonchi GI/GU:  non-distended, non-tender MSK/SPINE:  No gross deformities, mild symmetric bilateral swelling to lower extremities SKIN:  no rash, atraumatic   *Additional and/or pertinent findings included in MDM below  Diagnostic and Interventional Summary    EKG Interpretation  Date/Time:    Ventricular Rate:    PR Interval:    QRS Duration:   QT Interval:    QTC Calculation:   R Axis:     Text Interpretation:         Labs Reviewed - No data to display  No orders to display    Medications - No data to display   Procedures  /  Critical Care Procedures  ED  Course and Medical Decision Making  Initial Impression and Ddx Suspect venous insufficiency, overall doubt heart failure given lack of other signs, doubt kidney failure or liver failure.  Highly doubt DVT.  Past medical/surgical history that increases complexity of ED encounter: None  Interpretation of Diagnostics I personally reviewed the prior laboratory assessment and my interpretation is as follows: No significant blood count or electrolyte disturbance    Patient Reassessment and Ultimate Disposition/Management     Discharge  Patient management required discussion with the following services or consulting groups:  None  Complexity of Problems Addressed Acute illness or injury that poses threat of life of bodily function  Additional Data Reviewed and Analyzed Further history obtained from: None  Additional Factors Impacting ED Encounter  Risk None  Barth Kirks. Sedonia Small, MD [place] [email or web address]  Final Clinical Impressions(s) / ED Diagnoses     ICD-10-CM   1. Leg swelling  M79.89       ED Discharge Orders     None        Discharge Instructions Discussed with and Provided to Patient:    Discharge Instructions      You were evaluated in the Emergency Department and after careful evaluation, we did not find any emergent condition requiring admission or further testing in the hospital.  Your exam/testing today was overall reassuring.  Recommend elevating your legs as often as you can during the day, use compression socks as we discussed.  Please return to the Emergency Department if you experience any worsening of your condition.  Thank you for allowing [place] to be a part of your care.       Maudie Flakes, MD 07/01/22 [phone]

## 2022-07-01 NOTE — ED Triage Notes (Signed)
Pt BIB [facility] from [address]. Pt states that her feet are swollen and 10/10 pain. Pt has back pain 10/10 and would like all SI checks done per her current partner she needs to be checked for everything.

## 2022-07-02 ENCOUNTER — Telehealth: Payer: Self-pay

## 2022-07-02 NOTE — Telephone Encounter (Signed)
Transition Care Management Unsuccessful Follow-up Telephone Call  Date of discharge and from where:  07/01/2022 [facility]  Attempts:  1st Attempt  Reason for unsuccessful TCM follow-up call:  Left voice message

## 2022-07-03 NOTE — Telephone Encounter (Signed)
Transition Care Management Unsuccessful Follow-up Telephone Call  Date of discharge and from where:  07/01/2022 [facility]   Attempts:  3rd Attempt  Reason for unsuccessful TCM follow-up call:  No answer/busy letter mailed

## 2022-07-03 NOTE — Telephone Encounter (Signed)
Transition Care Management Unsuccessful Follow-up Telephone Call  Date of discharge and from where:  07/01/2022 [facility]   Attempts:  2nd Attempt  Reason for unsuccessful TCM follow-up call:  No answer/busy

## 2022-07-07 ENCOUNTER — Telehealth: Payer: Self-pay

## 2022-07-07 NOTE — Telephone Encounter (Signed)
Transition Care Management Unsuccessful Follow-up Telephone Call  Date of discharge and from where:  [facility]  Attempts:  1st Attempt  Reason for unsuccessful TCM follow-up call:  No answer/busy

## 2022-07-08 NOTE — Telephone Encounter (Signed)
Transition Care Management Unsuccessful Follow-up Telephone Call  Date of discharge and from where:  [facility]  Attempts:  2nd Attempt  Reason for unsuccessful TCM follow-up call:  No answer/busy

## 2022-07-08 NOTE — Telephone Encounter (Signed)
Transition Care Management Unsuccessful Follow-up Telephone Call  Date of discharge and from where: [facility]  Attempts:  3rd Attempt  Reason for unsuccessful TCM follow-up call:  No answer/busy - letter mailed

## 2022-07-10 ENCOUNTER — Encounter: Payer: Medicaid Other | Admitting: Nurse Practitioner

## 2022-07-11 ENCOUNTER — Telehealth: Payer: Self-pay

## 2022-07-11 NOTE — Telephone Encounter (Signed)
Transition Care Management Unsuccessful Follow-up Telephone Call  Date of discharge and from where:  [facility], 07/05/2022  Attempts:  1st Attempt  Reason for unsuccessful TCM follow-up call:  No answer/busy

## 2022-07-15 NOTE — Telephone Encounter (Signed)
Transition Care Management Unsuccessful Follow-up Telephone Call  Date of discharge and from where:  07/05/2022 [place]   Attempts:  2nd Attempt  Reason for unsuccessful TCM follow-up call:  Left voice message

## 2022-07-17 NOTE — Telephone Encounter (Signed)
Transition Care Management Unsuccessful Follow-up Telephone Call  Date of discharge and from where:  07/05/22 [facility]  Attempts:  3rd Attempt  Reason for unsuccessful TCM follow-up call:  Left voice message - letter mailed

## 2022-07-22 ENCOUNTER — Encounter: Payer: Medicaid Other | Admitting: Women's Health

## 2022-07-25 ENCOUNTER — Telehealth: Payer: Self-pay | Admitting: Nurse Practitioner

## 2022-07-25 NOTE — Telephone Encounter (Signed)
Transition Care Management Unsuccessful Follow-up Telephone Call  Date of discharge and from where:  07/24/22 [facility]  Attempts:  2nd Attempt  Reason for unsuccessful TCM follow-up call:  Left voice message

## 2022-07-29 NOTE — Telephone Encounter (Signed)
Transition Care Management Unsuccessful Follow-up Telephone Call  Date of discharge and from where:  07/24/22 [facility]   Attempts:  2nd Attempt  Reason for unsuccessful TCM follow-up call:  Left voice message

## 2022-07-30 NOTE — Telephone Encounter (Signed)
Transition Care Management Unsuccessful Follow-up Telephone Call  Date of discharge and from where:  07/24/22 [facility]   Attempts:  3rd Attempt  Reason for unsuccessful TCM follow-up call:  Left voice message - sent letter through [place] and mailed

## 2023-06-03 ENCOUNTER — Ambulatory Visit
Admission: EM | Admit: 2023-06-03 | Discharge: 2023-06-03 | Disposition: A | Discharge status: Home / Self Care | Payer: Medicaid Other

## 2023-06-03 ENCOUNTER — Other Ambulatory Visit: Payer: Self-pay

## 2023-06-12 ENCOUNTER — Encounter: Payer: Medicaid Other | Admitting: Nurse Practitioner

## 2023-07-23 ENCOUNTER — Ambulatory Visit: Payer: MEDICAID

## 2023-08-11 ENCOUNTER — Telehealth: Payer: Self-pay

## 2023-08-11 NOTE — Transitions of Care (Post Inpatient/ED Visit) (Signed)
   08/11/2023  Name: Pamela Norris MRN: [id number] DOB: 2001/08/25  Today's TOC FU Call Status: Today's TOC FU Call Status:: Unsuccessful Call (1st Attempt) Unsuccessful Call (1st Attempt) Date: 08/11/23  Attempted to reach the patient regarding the most recent Inpatient/ED visit.  Follow Up Plan: Additional outreach attempts will be made to reach the patient to complete the Transitions of Care (Post Inpatient/ED visit) call.   Abby Jasslyn Finkel, CMA  [facility] AWV Team Direct Dial: [phone]

## 2023-08-18 ENCOUNTER — Telehealth: Payer: Self-pay

## 2023-08-18 NOTE — Transitions of Care (Post Inpatient/ED Visit) (Signed)
   08/18/2023  Name: Pamela Norris MRN: [id number] DOB: 01-Feb-2001  Today's TOC FU Call Status: Today's TOC FU Call Status:: Unsuccessful Call (1st Attempt) Unsuccessful Call (1st Attempt) Date: 08/18/23  Attempted to reach the patient regarding the most recent Inpatient/ED visit.  Follow Up Plan: Additional outreach attempts will be made to reach the patient to complete the Transitions of Care (Post Inpatient/ED visit) call.   Abby Marice Guidone, CMA  [facility] AWV Team Direct Dial: [phone]

## 2023-08-25 ENCOUNTER — Ambulatory Visit: Payer: MEDICAID | Admitting: Nurse Practitioner

## 2023-08-31 ENCOUNTER — Telehealth: Payer: Self-pay

## 2023-08-31 NOTE — Telephone Encounter (Signed)
Copied from CRM [id number]. Topic: Clinical - Medication Question >> Aug 31, 2023  9:55 AM Mosetta Putt H wrote: Reason for CRM: need to be seen for birth control and meningitis b shot

## 2023-08-31 NOTE — Telephone Encounter (Signed)
Patient has appt for 12/19

## 2023-09-03 ENCOUNTER — Encounter: Payer: Self-pay | Admitting: Nurse Practitioner

## 2023-09-03 ENCOUNTER — Ambulatory Visit (INDEPENDENT_AMBULATORY_CARE_PROVIDER_SITE_OTHER): Payer: MEDICAID | Admitting: Nurse Practitioner

## 2023-09-03 VITALS — BP 118/80 | HR 74 | Temp 97.1°F | Ht 68.0 in | Wt 143.0 lb

## 2023-09-03 DIAGNOSIS — Z23 Encounter for immunization: Secondary | ICD-10-CM

## 2023-09-03 DIAGNOSIS — Z3009 Encounter for other general counseling and advice on contraception: Secondary | ICD-10-CM | POA: Diagnosis not present

## 2023-09-03 NOTE — Patient Instructions (Signed)
Contraceptive Injection A contraceptive injection is a shot that prevents pregnancy. It is also called a birth control shot. The shot contains the hormone progestin, which prevents pregnancy by: Stopping the ovaries from releasing eggs. Thickening cervical mucus to prevent sperm from entering the cervix. Thinning the lining of the uterus to prevent a fertilized egg from attaching to the uterus. Contraceptive injections are given under the skin (subcutaneous) or into a muscle (intramuscular). For these shots to work, you must get one of them every 3 months (12-13 weeks) from a health care provider. Tell a health care provider about: Any allergies you have. All medicines you are taking, including vitamins, herbs, eye drops, creams, and over-the-counter medicines. Any blood disorders you have. Any medical conditions you have. Whether you are pregnant or may be pregnant. What are the risks? Generally, this is a safe procedure. However, problems may occur, including: Mood changes or depression. Loss of bone density (osteoporosis) after long-term use. Blood clots. This is rare. Higher risk of an egg being fertilized outside your uterus (ectopic pregnancy).This is rare. What happens before the procedure? Your health care provider may do a routine physical exam. You may have a test to make sure you are not pregnant. What happens during the procedure?  The area where the shot will be given will be cleaned and sanitized with alcohol. A needle will be inserted into a muscle in your upper arm or buttock, or into the skin of your thigh or abdomen. The needle will be attached to a syringe with the medicine inside of it. The medicine will be pushed through the syringe and injected into your body. A small bandage (dressing) may be placed over the injection site. What can I expect after the procedure? After the procedure, it is common to have: Soreness around the injection site for a couple of  days. Irregular menstrual bleeding. Weight gain. Breast tenderness. Headaches. Discomfort in your abdomen. Ask your health care provider whether you need to use an added method of birth control (backup contraception), such as a condom, sponge, or spermicide. If the first shot is given 1-7 days after the start of your last menstrual period, you will not need backup contraception. If the first shot is given at any other time during your menstrual cycle, you should avoid having sex. If you do have sex, you will need to use backup contraception for 7 days after you receive the shot. Follow these instructions at home: General instructions Take over-the-counter and prescription medicines only as told by your health care provider. Do not rub or massage the injection site. Track your menstrual periods so you will know if they become irregular. Always use a condom to protect against sexually transmitted infections (STIs). Make sure you schedule an appointment in time for your next shot and mark it on your calendar. You must get an injection every 3 months (12-13 weeks) to prevent pregnancy. Lifestyle Do not use any products that contain nicotine or tobacco. These products include cigarettes, chewing tobacco, and vaping devices, such as e-cigarettes. If you need help quitting, ask your health care provider. Eat foods that are high in calcium and vitamin D, such as milk, cheese, and salmon. Doing this may help with any loss in bone density caused by the contraceptive injection. Ask your health care provider for dietary recommendations. Contact a health care provider if you: Have nausea or vomiting. Have abnormal vaginal discharge or bleeding. Miss a menstrual period or think you might be pregnant. Experience mood changes   or depression. Feel dizzy or light-headed. Have leg pain. Get help right away if you: Have chest pain or cough up blood. Have shortness of breath. Have a severe headache that does  not go away. Have numbness in any part of your body. Have slurred speech or vision problems. Have vaginal bleeding that is abnormally heavy or does not stop, or you have severe pain in your abdomen. Have depression that does not get better with treatment. If you ever feel like you may hurt yourself or others, or have thoughts about taking your own life, get help right away. Go to your nearest emergency department or: Call your local emergency services (911 in the [place] Call a suicide crisis helpline, such as the National Suicide Prevention Lifeline at 1-800-273-8255 or 988 in the [place] This is open 24 hours a day in the [place] Text the Crisis Text Line at 741741 (in the [place] Summary A contraceptive injection is a shot that prevents pregnancy. It is also called the birth control shot. The shot is given under the skin (subcutaneous) or into a muscle (intramuscular). After this procedure, it is common to have soreness around the injection site for a couple of days. To prevent pregnancy, the shot must be given by a health care provider every 3 months (12-13 weeks). After you have the shot, ask your health care provider whether you need to use an added method of birth control (backup contraception), such as a condom, sponge, or spermicide. This information is not intended to replace advice given to you by your health care provider. Make sure you discuss any questions you have with your health care provider. Document Revised: 03/27/2021 Document Reviewed: 03/12/2020 Elsevier Patient Education  2024 Elsevier Inc.  

## 2023-09-03 NOTE — Progress Notes (Signed)
Subjective:    Patient ID: Pamela Norris, female    DOB: September 19, 2000, 22 y.o.   MRN: [id number]   Chief Complaint: Wants to restart birth control   HPI  Patient just recently got out of jail recently and is living in a group home. She is here to restart birth control. Wants meningitis vaccine. 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  Eyes:  Negative for pain.  Respiratory:  Negative for shortness of breath.   Cardiovascular:  Negative for chest pain, palpitations and leg swelling.  Gastrointestinal:  Negative for abdominal pain.  Endocrine: Negative for polydipsia.  Skin:  Negative for rash.  Neurological:  Negative for dizziness, weakness and headaches.  Hematological:  Does not bruise/bleed easily.  All other systems reviewed and are negative.      Objective:   Physical Exam Vitals and nursing note reviewed.  Constitutional:      General: She is not in acute distress.    Appearance: Normal appearance. She is well-developed.  HENT:     Head: Normocephalic.     Right Ear: Tympanic membrane normal.     Left Ear: Tympanic membrane normal.      Nose: Nose normal.     Mouth/Throat:     Mouth: Mucous membranes are moist.  Eyes:     Pupils: Pupils are equal, round, and reactive to light.  Neck:     Vascular: No carotid bruit or JVD.  Cardiovascular:     Rate and Rhythm: Normal rate and regular rhythm.     Heart sounds: Normal heart sounds.  Pulmonary:     Effort: Pulmonary effort is normal. No respiratory distress.     Breath sounds: Normal breath sounds. No wheezing or rales.  Chest:     Chest wall: No tenderness.  Abdominal:     General: Bowel sounds are normal. There is no distension or abdominal bruit.     Palpations: Abdomen is soft. There is no hepatomegaly, splenomegaly, mass or pulsatile mass.     Tenderness: There is no abdominal tenderness.  Musculoskeletal:        General: Normal range of motion.     Cervical back: Normal range of motion and neck supple.  Lymphadenopathy:     Cervical: No cervical adenopathy.  Skin:    General: Skin is warm and dry.  Neurological:     Mental Status: She is alert and oriented to person, place, and time.     Deep Tendon Reflexes: Reflexes are normal and symmetric.  Psychiatric:        Behavior: Behavior normal.        Thought Content: Thought content normal.        Judgment: Judgment normal.    BP  118/80   Pulse 74   Temp (!) 97.1 F (36.2 C) (Temporal)   Ht 5\' 8"  (1.727 m)   Wt 143 lb (64.9 kg)   BMI 21.74 kg/m         Assessment & Plan:   Pamela Norris in today with chief complaint of Wants to restart birth control   1. Birth control counseling (Primary) Waiting on pregnancy results then will order depo shot - Beta hCG quant (ref lab)    The above assessment and management plan was discussed with the patient. The patient verbalized understanding of and has agreed to the management plan. Patient is aware to call the clinic if symptoms persist or worsen. Patient is aware when to return to the clinic for a follow-up visit. Patient educated on when it is  appropriate to go to the emergency department.   Mary-Margaret Daphine Deutscher, FNP

## 2023-09-04 ENCOUNTER — Telehealth: Payer: Self-pay | Admitting: Nurse Practitioner

## 2023-09-04 LAB — BETA HCG QUANT (REF LAB): hCG Quant: <1 m[IU]/mL

## 2023-09-04 MED ORDER — MEDROXYPROGESTERONE ACETATE 150 MG/ML IM SUSP: 150.0000 mg | INTRAMUSCULAR | 3 refills | Status: AC

## 2023-09-04 NOTE — Telephone Encounter (Signed)
Please review the blood work and send meds to pharmacy

## 2023-09-04 NOTE — Telephone Encounter (Signed)
Copied from CRM [id number]. Topic: Clinical - Medication Refill >> Sep 04, 2023 11:04 AM Desma Mcgregor wrote: Most Recent Primary Care Visit:  Provider: Bennie Pierini  Department: [facility]  Visit Type: OFFICE VISIT  Date: 09/03/2023  Medication: ***  Has the patient contacted their pharmacy?  (Agent: If no, request that the patient contact the pharmacy for the refill. If patient does not wish to contact the pharmacy document the reason why and proceed with request.) (Agent: If yes, when and what did the pharmacy advise?)  Is this the correct pharmacy for this prescription?  If no, delete pharmacy and type the correct one.  This is the patient's preferred pharmacy:  [facility] - [place], [place] - [address] [place] [address] Phone: [phone] Fax: [phone]  [facility] [address] [place] - [address] [place] [address] [place] [address] Phone: [phone] Fax: [phone]   Has the prescription been filled recently?   Is the patient out of the medication?   Has the patient been seen for an appointment in the last year OR does the patient have an upcoming appointment?   Can we respond through MyChart?   Agent: Please be advised that Rx refills may take up to 3 business days. We ask that you follow-up with your pharmacy.

## 2023-09-04 NOTE — Addendum Note (Signed)
Addended by: Bennie Pierini on: 09/04/2023 03:35 PM   Modules accepted: Orders

## 2023-09-04 NOTE — Addendum Note (Signed)
Addended by: Cleda Daub on: 09/04/2023 04:07 PM   Modules accepted: Orders

## 2023-09-24 ENCOUNTER — Ambulatory Visit: Payer: MEDICAID | Admitting: Nurse Practitioner

## 2023-10-14 ENCOUNTER — Telehealth: Payer: Self-pay | Admitting: Nurse Practitioner

## 2023-10-14 NOTE — Telephone Encounter (Signed)
Returned call. NA  Copied from CRM [id number]. Topic: MyChart - Other >> Oct 14, 2023  2:57 PM Alvino Blood C wrote: Reason for CRM: Patient is needing assistance with logging into my chart. She would like a call for further assistance

## 2023-10-15 ENCOUNTER — Encounter: Payer: Self-pay | Admitting: Family Medicine

## 2023-10-15 ENCOUNTER — Telehealth: Payer: Self-pay | Admitting: Nurse Practitioner

## 2023-10-15 ENCOUNTER — Ambulatory Visit (INDEPENDENT_AMBULATORY_CARE_PROVIDER_SITE_OTHER): Payer: MEDICAID | Admitting: Family Medicine

## 2023-10-15 VITALS — BP 102/68 | HR 81 | Temp 98.2°F | Ht 68.0 in | Wt 147.0 lb

## 2023-10-15 DIAGNOSIS — F17201 Nicotine dependence, unspecified, in remission: Secondary | ICD-10-CM

## 2023-10-15 DIAGNOSIS — R41840 Attention and concentration deficit: Secondary | ICD-10-CM

## 2023-10-15 DIAGNOSIS — N393 Stress incontinence (female) (male): Secondary | ICD-10-CM

## 2023-10-15 DIAGNOSIS — R404 Transient alteration of awareness: Secondary | ICD-10-CM

## 2023-10-15 DIAGNOSIS — R7309 Other abnormal glucose: Secondary | ICD-10-CM | POA: Diagnosis not present

## 2023-10-15 DIAGNOSIS — Z8639 Personal history of other endocrine, nutritional and metabolic disease: Secondary | ICD-10-CM

## 2023-10-15 DIAGNOSIS — F332 Major depressive disorder, recurrent severe without psychotic features: Secondary | ICD-10-CM

## 2023-10-15 MED ORDER — NICOTINE POLACRILEX 2 MG MT GUM: 2.0000 mg | CHEWING_GUM | OROMUCOSAL | 0 refills | Status: DC | PRN

## 2023-10-15 NOTE — Progress Notes (Signed)
Subjective:  Patient ID: Pamela Norris, female    DOB: 09-08-2001, 22 y.o.   MRN: [id number]  Patient Care Team: Bennie Pierini, FNP as PCP - General (Family Medicine)   Chief Complaint:  lab check  HPI: Pamela Norris is a 23 y.o. female presenting on 10/15/2023 for lab check  States that she has concerns for family history and would like to be checked for diabetes. She is very concerned as her mother was diagnosed with diabetes at 94 years old and patient is now 27. She is also concerned that she had a seizure and would like follow up.  States that she believes that she had a seizure a few weeks ago. Is unsure if it was a seizure or other panic attack. Reports that a friend was with her when it started and ran to get her mother. Mom states that she was shaking, hyperventilating, and would not move hands from her face. She would not respond to her family. States that she could not remember anything after the event. She has not gone to neurology in the past.  Denies tongue biting or loss of bladder control during the event. States that it lasted 15-20 minutes.   In addition, she is concerned for lack of focus and inability to complete tasks. She is established at [facility] that she was diagnosed with ADHD when she was 18 and she would like to restart treatment.   In addition, she is concerned about her lack of Bladder control. States that sometimes she cannot hold her urine. States that it happens every time she laughs and when she sneezes. States that she does not lift heavy objects or workout so she is unsure if it happens then.   In addition, would like a refill of nicorette as she is trying to quit smoking.  Relevant past medical, surgical, family, and social history reviewed and updated as indicated.  Allergies and medications reviewed and updated. Data reviewed: Chart in Epic.   Past Medical History:  Diagnosis Date   ADHD (attention deficit hyperactivity  disorder)    Anxiety    Eustachian tube dysfunction 11/11   Headache    OCD (obsessive compulsive disorder)    mild    ODD (oppositional defiant disorder)    some     No past surgical history on file.  Social History   Socioeconomic History   Marital status: Single    Spouse name: Not on file   Number of children: 0   Years of education: Not on file   Highest education level: 9th grade  Occupational History   Not on file  Tobacco Use   Smoking status: Never   Smokeless tobacco: Never  Vaping Use   Vaping status: Never Used  Substance and Sexual Activity   Alcohol use: Never   Drug use: Never   Sexual activity: Never    Birth control/protection: Injection  Other Topics Concern   Not on file  Social History Narrative   Pamela Norris is a 9th grade student.   She lives with her adoptive parents. She has five siblings.   She enjoys making jewelry,  and giving her dogs baths.   Social Drivers of Corporate investment banker Strain: Low Risk  (07/13/2022)   Received from [facility]   Overall Financial Resource Strain (CARDIA)    Difficulty of Paying Living Expenses: Not hard at all  Food Insecurity: Not on file  Transportation Needs: Not on file  Physical Activity: Not on file  Stress: Stress Concern Present (07/13/2022)   Received from [facility] - Occupational Stress Questionnaire    Feeling of Stress : Rather much  Social Connections: Unknown (01/28/2022)   Received from [facility]   Social Network    Social Network: Not on file  Intimate Partner Violence: Not At Risk (08/23/2023)   Received from [facility]   HITS    Over the last 12 months how often did your partner physically hurt you?: Never    Over the last 12 months how often did your partner insult you or talk down to you?: Never    Over the last 12 months how often did your partner threaten you with physical harm?: Never    Over the last 12 months how  often did your partner scream or curse at you?: Never    Outpatient Encounter Medications as of 10/15/2023  Medication Sig   albuterol (VENTOLIN HFA) 108 (90 Base) MCG/ACT inhaler Inhale into the lungs.   azelastine (ASTELIN) 0.1 % nasal spray Place into the nose.   carbamazepine (TEGRETOL) 200 MG tablet Take 200 mg by mouth 2 (two) times daily.   ibuprofen (ADVIL) 800 MG tablet Take by mouth.   medroxyPROGESTERone (DEPO-PROVERA) 150 MG/ML injection Inject 1 mL (150 mg total) into the muscle every 3 (three) months.   nicotine polacrilex (NICORETTE) 2 MG gum Take by mouth.   ondansetron (ZOFRAN-ODT) 4 MG disintegrating tablet Take 4 mg by mouth every 6 (six) hours.   QUEtiapine (SEROQUEL) 50 MG tablet Take 50 mg by mouth at bedtime.   No facility-administered encounter medications on file as of 10/15/2023.    Allergies  Allergen Reactions   Banana Itching   Trazodone And Nefazodone Itching    Review of Systems As per HPI  Objective:  BP 102/68   Pulse 81   Temp 98.2 F (36.8 C)   Ht 5\' 8"  (1.727 m)   Wt 147 lb (66.7 kg)   SpO2 96%   BMI 22.35 kg/m    Wt Readings from Last 3 Encounters:  10/15/23 147 lb (66.7 kg)  09/03/23 143 lb (64.9 kg)  07/01/22 177 lb (80.3 kg) (94%, Z= 1.57)*   * Growth percentiles are based on CDC (Girls, 2-20 Years) data.    Physical Exam Constitutional:      General: She is awake. She is not in acute distress.    Appearance: Normal appearance. She is well-developed and well-groomed. She is not ill-appearing, toxic-appearing or diaphoretic.  Cardiovascular:     Rate and Rhythm: Normal rate and regular rhythm.     Pulses: Normal pulses.          Radial pulses are 2+ on the right side and 2+ on the left side.       Posterior tibial pulses are 2+ on the right side and 2+ on the left side.     Heart sounds: Normal heart sounds. No murmur heard.    No gallop.  Pulmonary:     Effort: Pulmonary effort is normal. No respiratory distress.      Breath sounds: Normal breath sounds. No stridor. No wheezing, rhonchi or rales.  Musculoskeletal:     Cervical back: Full passive range of motion without pain and neck supple.     Right lower leg: No edema.     Left lower leg: No edema.  Skin:    General: Skin is warm.  Capillary Refill: Capillary refill takes less than 2 seconds.  Neurological:     General: No focal deficit present.     Mental Status: She is alert, oriented to person, place, and time and easily aroused. Mental status is at baseline.     GCS: GCS eye subscore is 4. GCS verbal subscore is 5. GCS motor subscore is 6.     Motor: No weakness.  Psychiatric:        Attention and Perception: Attention and perception normal.        Mood and Affect: Affect normal. Mood is anxious.        Speech: Speech is rapid and pressured and tangential.        Behavior: Behavior is hyperactive. Behavior is cooperative.        Thought Content: Thought content normal. Thought content does not include homicidal or suicidal ideation. Thought content does not include homicidal or suicidal plan.        Cognition and Memory: Cognition and memory normal.        Judgment: Judgment normal.     Results for orders placed or performed in visit on 09/03/23  Beta hCG quant (ref lab)   Collection Time: 09/03/23 12:31 PM  Result Value Ref Range   hCG Quant <1 mIU/mL       10/15/2023    3:56 PM 09/03/2023    2:00 PM 04/10/2022   11:07 AM 08/27/2021   10:41 AM 08/01/2021    4:32 PM  Depression screen PHQ 2/9  Decreased Interest 0 0 0 1 1  Down, Depressed, Hopeless 1 1 0 2 1  PHQ - 2 Score 1 1 0 3 2  Altered sleeping 3  3 1 1   Tired, decreased energy 2  0 1 1  Change in appetite 2  1 3 3   Feeling bad or failure about yourself  3  0 3 3  Trouble concentrating 3  0 2 1  Moving slowly or fidgety/restless 3  3 3 3   Suicidal thoughts 1  0 0 0  PHQ-9 Score 18  7 16 14   Difficult doing work/chores Not difficult at all  Somewhat difficult Extremely  dIfficult Very difficult       10/15/2023    3:55 PM 09/03/2023    2:00 PM 04/10/2022   11:08 AM 08/27/2021   10:42 AM  GAD 7 : Generalized Anxiety Score  Nervous, Anxious, on Edge 3 3 3 3   Control/stop worrying 3 3 3 3   Worry too much - different things 3 3 3 3   Trouble relaxing 3 2 3 3   Restless 3 3 3 3   Easily annoyed or irritable 3 3 3 3   Afraid - awful might happen 3 0 2 3  Total GAD 7 Score 21 17 20 21   Anxiety Difficulty Not difficult at all Not difficult at all Very difficult Extremely difficult    Pertinent labs & imaging results that were available during my care of the patient were reviewed by me and considered in my medical decision making.  Assessment & Plan:  Margo was seen today for lab check.  Diagnoses and all orders for this visit:  Stress incontinence Referral placed as below for patient to pursue pelvic floor PT.  -     Ambulatory referral to Physical Therapy  History of elevated glucose Patient had history of elevated glucose in the past. Given concern for family history, will collect labs as below.  -     [facility]  Hb A1c Waived  Elevated glucose level As above.  -     CMP14+EGFR  Transient alteration of awareness Discussed with patient red flag symptoms to monitor. Referral placed as below for further evaluation.  -     Ambulatory referral to Neurology  Nicotine dependence in remission, unspecified nicotine product type Refill provided.  -     nicotine polacrilex (NICORETTE) 2 MG gum; Take 1 each (2 mg total) by mouth as needed for smoking cessation. Maximum:24 pieces/day  Severe episode of recurrent major depressive disorder, without psychotic features (HCC) Elevated screening scores today in office. Patient denies active plans of self harm. She is established with [facility] and plans to follow up with them.    Lack of concentration Patient would like to restart treatment of ADHD. Encouraged her to follow up with PCP with [facility].   Continue  all other maintenance medications.  Follow up plan: Return if symptoms worsen or fail to improve.  Continue healthy lifestyle choices, including diet (rich in fruits, vegetables, and lean proteins, and low in salt and simple carbohydrates) and exercise (at least 30 minutes of moderate physical activity daily).  Written and verbal instructions provided   The above assessment and management plan was discussed with the patient. The patient verbalized understanding of and has agreed to the management plan. Patient is aware to call the clinic if they develop any new symptoms or if symptoms persist or worsen. Patient is aware when to return to the clinic for a follow-up visit. Patient educated on when it is appropriate to go to the emergency department.   Neale Burly, DNP-FNP [facility] [address] [place], [place] [address] [phone]

## 2023-10-16 ENCOUNTER — Encounter: Payer: Self-pay | Admitting: Family Medicine

## 2023-10-16 LAB — CMP14+EGFR
ALT: 22 [IU]/L (ref 0–32)
AST: 19 [IU]/L (ref 0–40)
Albumin: 4.5 g/dL (ref 4.0–5.0)
Alkaline Phosphatase: 103 [IU]/L (ref 42–106)
BUN/Creatinine Ratio: 33 — ABNORMAL HIGH (ref 9–23)
BUN: 15 mg/dL (ref 6–20)
Bilirubin Total: <0.2 mg/dL (ref 0.0–1.2)
CO2: 20 mmol/L (ref 20–29)
Calcium: 9.4 mg/dL (ref 8.7–10.2)
Chloride: 108 mmol/L — ABNORMAL HIGH (ref 96–106)
Creatinine, Ser: 0.45 mg/dL — ABNORMAL LOW (ref 0.57–1.00)
Globulin, Total: 3 g/dL (ref 1.5–4.5)
Glucose: 86 mg/dL (ref 70–99)
Potassium: 4 mmol/L (ref 3.5–5.2)
Sodium: 145 mmol/L — ABNORMAL HIGH (ref 134–144)
Total Protein: 7.5 g/dL (ref 6.0–8.5)
eGFR: 141 mL/min/{1.73_m2} (ref >59)

## 2023-10-16 LAB — BAYER DCA HB A1C WAIVED: HB A1C (BAYER DCA - WAIVED): 5.2 % (ref 4.8–5.6)

## 2023-10-16 NOTE — Telephone Encounter (Signed)
I am sorry but am not accepting patients.

## 2023-10-16 NOTE — Telephone Encounter (Signed)
Please call to schedule new appt with Jerrel Ivory

## 2023-10-16 NOTE — Progress Notes (Signed)
Slight variation on labs, suggesting dehydration, recommend 80-100 oz of water daily. Will repeat labs in the future to monitor.

## 2023-10-16 NOTE — Telephone Encounter (Signed)
Fine with me to switch 

## 2023-10-16 NOTE — Telephone Encounter (Signed)
Patient wants to call or be called on Monday February 3rd to reschedule

## 2023-10-19 NOTE — Telephone Encounter (Signed)
Schedule cpe on June 26. Pt wanted summer appt for physical

## 2023-10-20 ENCOUNTER — Encounter: Payer: MEDICAID | Admitting: Obstetrics & Gynecology

## 2023-10-24 ENCOUNTER — Telehealth: Payer: MEDICAID | Admitting: Family Medicine

## 2023-10-24 DIAGNOSIS — J111 Influenza due to unidentified influenza virus with other respiratory manifestations: Secondary | ICD-10-CM

## 2023-10-24 MED ORDER — PROMETHAZINE-DM 6.25-15 MG/5ML PO SYRP: 5.0000 mL | ORAL_SOLUTION | Freq: Four times a day (QID) | ORAL | 0 refills | Status: AC | PRN

## 2023-10-24 MED ORDER — OSELTAMIVIR PHOSPHATE 75 MG PO CAPS: 75.0000 mg | ORAL_CAPSULE | Freq: Two times a day (BID) | ORAL | 0 refills | Status: AC

## 2023-10-24 NOTE — Patient Instructions (Signed)

## 2023-10-24 NOTE — Progress Notes (Signed)
 Virtual Visit Consent   Pamela Norris, you are scheduled for a virtual visit with a [facility] provider today. Just as with appointments in the office, your consent must be obtained to participate. Your consent will be active for this visit and any virtual visit you may have with one of our providers in the next 365 days. If you have a MyChart account, a copy of this consent can be sent to you electronically.  As this is a virtual visit, video technology does not allow for your provider to perform a traditional examination. This may limit your provider's ability to fully assess your condition. If your provider identifies any concerns that need to be evaluated in person or the need to arrange testing (such as labs, EKG, etc.), we will make arrangements to do so. Although advances in technology are sophisticated, we cannot ensure that it will always work on either your end or our end. If the connection with a video visit is poor, the visit may have to be switched to a telephone visit. With either a video or telephone visit, we are not always able to ensure that we have a secure connection.  By engaging in this virtual visit, you consent to the provision of healthcare and authorize for your insurance to be billed (if applicable) for the services provided during this visit. Depending on your insurance coverage, you may receive a charge related to this service.  I need to obtain your verbal consent now. Are you willing to proceed with your visit today? Pamela Norris has provided verbal consent on 10/24/2023 for a virtual visit (video or telephone). Loa Lamp, FNP  Date: 10/24/2023 3:46 PM  Virtual Visit via Video Note   I, Loa Lamp, connected with  Pamela Norris  ([id number], 2001/02/23) on 10/24/23 at  3:45 PM EST by a video-enabled telemedicine application and verified that I am speaking with the correct person using two identifiers.  Location: Patient: Virtual Visit Location Patient:  Home Provider: Virtual Visit Location Provider: Home Office   I discussed the limitations of evaluation and management by telemedicine and the availability of in person appointments. The patient expressed understanding and agreed to proceed.    History of Present Illness: Pamela Norris is a 23 y.o. who identifies as a female who was assigned female at birth, and is being seen today for fever, body chills, cough for 2 days. [facility]. They thought it was too early to test. .  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   MDD (major depressive disorder), recurrent episode (HCC) 10/06/2021   Adjustment disorder with mixed anxiety and depressed mood 10/06/2021   DMDD (disruptive mood dysregulation disorder) (HCC) 11/18/2020   Bilateral temporomandibular joint pain 09/15/2020   Viral pharyngitis 07/12/2020   Conduct disorder 07/13/2019   Oppositional defiant disorder    Disruptive mood dysregulation disorder (HCC) 11/08/2015   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   ADHD (attention deficit hyperactivity disorder), combined type 03/21/2013    Allergies:  Allergies  Allergen Reactions   Banana Itching   Trazodone  And Nefazodone Itching   Medications:  Current Outpatient Medications:    albuterol  (VENTOLIN  HFA) 108 (90 Base) MCG/ACT inhaler, Inhale into the lungs., Disp: , Rfl:  azelastine  (ASTELIN ) 0.1 % nasal spray, Place into the nose., Disp: , Rfl:    carbamazepine (TEGRETOL) 200 MG tablet, Take 200 mg by mouth 2 (two) times daily., Disp: , Rfl:    ibuprofen  (ADVIL ) 800 MG tablet, Take by mouth., Disp: , Rfl:    medroxyPROGESTERone  (DEPO-PROVERA ) 150 MG/ML injection, Inject 1 mL (150 mg total) into the muscle every 3 (three)  months., Disp: 1 mL, Rfl: 3   nicotine  polacrilex (NICORETTE ) 2 MG gum, Take 1 each (2 mg total) by mouth as needed for smoking cessation. Maximum:24 pieces/day, Disp: 100 tablet, Rfl: 0   ondansetron  (ZOFRAN -ODT) 4 MG disintegrating tablet, Take 4 mg by mouth every 6 (six) hours., Disp: , Rfl:    QUEtiapine  (SEROQUEL ) 50 MG tablet, Take 50 mg by mouth at bedtime., Disp: , Rfl:   Observations/Objective: Patient is well-developed, well-nourished in no acute distress.  Resting comfortably  at home.  Head is normocephalic, atraumatic.  No labored breathing.  Speech is clear and coherent with logical content.  Patient is alert and oriented at baseline.    Assessment and Plan: 1. Influenza (Primary)  Increase fluids, humidifier at night, tylenol  or ibuprofen  as directd, UC as needed.  Follow Up Instructions: I discussed the assessment and treatment plan with the patient. The patient was provided an opportunity to ask questions and all were answered. The patient agreed with the plan and demonstrated an understanding of the instructions.  A copy of instructions were sent to the patient via MyChart unless otherwise noted below.     The patient was advised to call back or seek an in-person evaluation if the symptoms worsen or if the condition fails to improve as anticipated.    Xia Stohr, FNP

## 2023-10-27 ENCOUNTER — Telehealth: Payer: MEDICAID

## 2023-10-29 ENCOUNTER — Encounter: Payer: MEDICAID | Admitting: Nurse Practitioner

## 2023-11-04 ENCOUNTER — Encounter: Payer: Self-pay | Admitting: Family Medicine

## 2023-11-04 ENCOUNTER — Telehealth (INDEPENDENT_AMBULATORY_CARE_PROVIDER_SITE_OTHER): Payer: MEDICAID | Admitting: Family Medicine

## 2023-11-04 ENCOUNTER — Ambulatory Visit: Payer: MEDICAID | Admitting: Family Medicine

## 2023-11-04 DIAGNOSIS — F332 Major depressive disorder, recurrent severe without psychotic features: Secondary | ICD-10-CM | POA: Diagnosis not present

## 2023-11-04 DIAGNOSIS — R233 Spontaneous ecchymoses: Secondary | ICD-10-CM

## 2023-11-04 DIAGNOSIS — F919 Conduct disorder, unspecified: Secondary | ICD-10-CM

## 2023-11-04 DIAGNOSIS — F902 Attention-deficit hyperactivity disorder, combined type: Secondary | ICD-10-CM

## 2023-11-04 DIAGNOSIS — F3481 Disruptive mood dysregulation disorder: Secondary | ICD-10-CM

## 2023-11-04 MED ORDER — VITAMIN C 250 MG PO TABS: 250.0000 mg | ORAL_TABLET | Freq: Every day | ORAL | 0 refills | Status: DC

## 2023-11-04 NOTE — Progress Notes (Signed)
 Virtual Visit via Video Note  I connected with Pamela Norris on 11/04/23 at 11:05 AM EST by a video enabled telemedicine application and verified that I am speaking with the correct person using two identifiers.  Patient Location: Home Provider Location: Office/Clinic  I discussed the limitations, risks, security, and privacy concerns of performing an evaluation and management service by video and the availability of in person appointments. I also discussed with the patient that there may be a patient responsible charge related to this service. The patient expressed understanding and agreed to proceed.  Subjective: PCP: Arrie Senate, FNP  Chief Complaint  Patient presents with   ADHD   HPI States that she talked to psychiatry this week and was told that they do not treat ADHD for adults. She is currently established with [facility]. States that she has not taken any medications since last September for ADHD. Notices that she cannot focus, sit still, or complete tasks. Feels that she talks a lot.   In addition, she states that she has increased bruising and she would like to start vitamin C supplementation.   ROS: Per HPI  Current Outpatient Medications:    albuterol (VENTOLIN HFA) 108 (90 Base) MCG/ACT inhaler, Inhale into the lungs., Disp: , Rfl:    azelastine (ASTELIN) 0.1 % nasal spray, Place into the nose., Disp: , Rfl:    carbamazepine (TEGRETOL) 200 MG tablet, Take 200 mg by mouth 2 (two) times daily., Disp: , Rfl:    ibuprofen (ADVIL) 800 MG tablet, Take by mouth., Disp: , Rfl:    medroxyPROGESTERone (DEPO-PROVERA) 150 MG/ML injection, Inject 1 mL (150 mg total) into the muscle every 3 (three) months., Disp: 1 mL, Rfl: 3   nicotine polacrilex (NICORETTE) 2 MG gum, Take 1 each (2 mg total) by mouth as needed for smoking cessation. Maximum:24 pieces/day, Disp: 100 tablet, Rfl: 0   ondansetron (ZOFRAN-ODT) 4 MG disintegrating tablet, Take 4 mg by mouth every 6 (six)  hours., Disp: , Rfl:    QUEtiapine (SEROQUEL) 50 MG tablet, Take 50 mg by mouth at bedtime., Disp: , Rfl:   Observations/Objective: There were no vitals filed for this visit. Physical Exam Constitutional:      General: She is awake. She is not in acute distress.    Appearance: Normal appearance. She is well-developed and well-groomed. She is not ill-appearing, toxic-appearing or diaphoretic.  Cardiovascular:     Rate and Rhythm: Regular rhythm.  Pulmonary:     Effort: Pulmonary effort is normal.  Neurological:     General: No focal deficit present.     Mental Status: She is alert and oriented to person, place, and time.  Psychiatric:        Attention and Perception: Attention and perception normal.        Mood and Affect: Mood and affect normal.        Speech: Speech is tangential.        Behavior: Behavior normal. Behavior is cooperative.        Thought Content: Thought content normal.        Cognition and Memory: Cognition and memory normal.        Judgment: Judgment normal.    Assessment and Plan: 1. Attention deficit hyperactivity disorder (ADHD), combined type (Primary) Patient is currently established with [facility], however, they will not treat ADHD. Patient would like treatment and is willing to transfer to another organization. Referral placed as below.  - Ambulatory referral to Psychiatry  2. Disruptive mood  dysregulation disorder (HCC) As above.  - Ambulatory referral to Psychiatry  3. Conduct disorder As above.  - Ambulatory referral to Psychiatry  4. MDD (major depressive disorder), recurrent severe, without psychosis (HCC) As above.  - Ambulatory referral to Psychiatry  5. Easy bruising Will trial supplement as below.  - vitamin C (ASCORBIC ACID) 250 MG tablet; Take 1 tablet (250 mg total) by mouth daily.  Dispense: 30 tablet; Refill: 0   Appt time 11/04/2023 11:05 AM Call duration: 00:07:50  Follow Up Instructions: Return if symptoms worsen or fail to  improve.   I discussed the assessment and treatment plan with the patient. The patient was provided an opportunity to ask questions, and all were answered. The patient agreed with the plan and demonstrated an understanding of the instructions.   The patient was advised to call back or seek an in-person evaluation if the symptoms worsen or if the condition fails to improve as anticipated.  The above assessment and management plan was discussed with the patient. The patient verbalized understanding of and has agreed to the management plan.   Neale Burly, DNP-FNP [facility] [address] [place], [place] [address] [phone]

## 2023-11-06 ENCOUNTER — Ambulatory Visit: Payer: MEDICAID | Admitting: Physical Therapy

## 2023-11-08 ENCOUNTER — Telehealth: Payer: MEDICAID | Admitting: Family

## 2023-11-08 DIAGNOSIS — K5900 Constipation, unspecified: Secondary | ICD-10-CM

## 2023-11-08 MED ORDER — POLYETHYLENE GLYCOL 3350 17 GM/SCOOP PO POWD: 17.0000 g | Freq: Two times a day (BID) | ORAL | 1 refills | Status: DC | PRN

## 2023-11-08 NOTE — Patient Instructions (Signed)

## 2023-11-08 NOTE — Progress Notes (Signed)
 Virtual Visit Consent   Pamela Norris, you are scheduled for a virtual visit with a [facility] provider today. Just as with appointments in the office, your consent must be obtained to participate. Your consent will be active for this visit and any virtual visit you may have with one of our providers in the next 365 days. If you have a MyChart account, a copy of this consent can be sent to you electronically.  As this is a virtual visit, video technology does not allow for your provider to perform a traditional examination. This may limit your provider's ability to fully assess your condition. If your provider identifies any concerns that need to be evaluated in person or the need to arrange testing (such as labs, EKG, etc.), we will make arrangements to do so. Although advances in technology are sophisticated, we cannot ensure that it will always work on either your end or our end. If the connection with a video visit is poor, the visit may have to be switched to a telephone visit. With either a video or telephone visit, we are not always able to ensure that we have a secure connection.  By engaging in this virtual visit, you consent to the provision of healthcare and authorize for your insurance to be billed (if applicable) for the services provided during this visit. Depending on your insurance coverage, you may receive a charge related to this service.  I need to obtain your verbal consent now. Are you willing to proceed with your visit today? MARCA GADSBY has provided verbal consent on 11/08/2023 for a virtual visit (video or telephone). Jannifer Rodney, FNP  Date: 11/08/2023 7:22 PM   Virtual Visit via Video Note   I, Jannifer Rodney, connected with  Pamela Norris  ([id number], [birth year]) on 11/08/23 at  7:30 PM EST by a video-enabled telemedicine application and verified that I am speaking with the correct person using two identifiers.  Location: Patient: Virtual Visit Location  Patient: Home Provider: Virtual Visit Location Provider: Home Office   I discussed the limitations of evaluation and management by telemedicine and the availability of in person appointments. The patient expressed understanding and agreed to proceed.    History of Present Illness: Pamela Norris is a 23 y.o. who identifies as a female who was assigned female at birth, and is being seen today for constipation that started a couple days ago.  HPI: Constipation This is a new problem. The current episode started 1 to 4 weeks ago. The problem is unchanged. Her stool frequency is 2 to 3 times per week. Associated symptoms include bloating, flatus and hematochezia. Pertinent negatives include no anorexia, back pain, diarrhea, fever, hemorrhoids, nausea, rectal pain, vomiting or weight loss. Risk factors include obesity. She has tried diet changes for the symptoms. The treatment provided mild relief.    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/06/2021   Adjustment disorder with mixed anxiety and depressed mood 10/06/2021   DMDD (disruptive mood dysregulation disorder) (HCC) 11/18/2020   Bilateral temporomandibular joint pain 09/15/2020   Viral pharyngitis 07/12/2020   Conduct disorder 07/13/2019   Oppositional defiant disorder    Disruptive mood dysregulation disorder (HCC) 11/08/2015   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   Attention deficit hyperactivity disorder (ADHD),  combined type 03/21/2013    Allergies:  Allergies  Allergen Reactions   Banana Itching   Trazodone And Nefazodone Itching   Medications:  Current Outpatient Medications:    polyethylene glycol powder (GLYCOLAX/MIRALAX) 17 GM/SCOOP powder, Take 17 g by mouth 2 (two) times daily as  needed., Disp: 3350 g, Rfl: 1   albuterol (VENTOLIN HFA) 108 (90 Base) MCG/ACT inhaler, Inhale into the lungs., Disp: , Rfl:    azelastine (ASTELIN) 0.1 % nasal spray, Place into the nose., Disp: , Rfl:    carbamazepine (TEGRETOL) 200 MG tablet, Take 200 mg by mouth 2 (two) times daily., Disp: , Rfl:    ibuprofen (ADVIL) 800 MG tablet, Take by mouth., Disp: , Rfl:    medroxyPROGESTERone (DEPO-PROVERA) 150 MG/ML injection, Inject 1 mL (150 mg total) into the muscle every 3 (three) months., Disp: 1 mL, Rfl: 3   nicotine polacrilex (NICORETTE) 2 MG gum, Take 1 each (2 mg total) by mouth as needed for smoking cessation. Maximum:24 pieces/day, Disp: 100 tablet, Rfl: 0   ondansetron (ZOFRAN-ODT) 4 MG disintegrating tablet, Take 4 mg by mouth every 6 (six) hours., Disp: , Rfl:    QUEtiapine (SEROQUEL) 50 MG tablet, Take 50 mg by mouth at bedtime., Disp: , Rfl:    vitamin C (ASCORBIC ACID) 250 MG tablet, Take 1 tablet (250 mg total) by mouth daily., Disp: 30 tablet, Rfl: 0  Observations/Objective: Patient is well-developed, well-nourished in no acute distress.  Resting comfortably  at home.  Head is normocephalic, atraumatic.  No labored breathing.  Speech is clear and coherent with logical content.  Patient is alert and oriented at baseline.    Assessment and Plan: 1. Constipation, unspecified constipation type (Primary) - polyethylene glycol powder (GLYCOLAX/MIRALAX) 17 GM/SCOOP powder; Take 17 g by mouth 2 (two) times daily as needed.  Dispense: 3350 g; Refill: 1  Force fluids Encourage high fiber Encourage exercise  Start Miralax BID then after several good BM can decrease to daily Follow up if symptoms worsen or do not improve   Follow Up Instructions: I discussed the assessment and treatment plan with the patient. The patient was provided an opportunity to ask questions and all were answered. The patient agreed with the plan and demonstrated an understanding of the instructions.  A copy  of instructions were sent to the patient via MyChart unless otherwise noted below.    The patient was advised to call back or seek an in-person evaluation if the symptoms worsen or if the condition fails to improve as anticipated.    Jannifer Rodney, FNP

## 2023-11-13 ENCOUNTER — Ambulatory Visit: Payer: MEDICAID | Admitting: Family Medicine

## 2023-11-17 ENCOUNTER — Encounter: Payer: Self-pay | Admitting: Family Medicine

## 2023-11-20 ENCOUNTER — Encounter: Payer: Self-pay | Admitting: Family Medicine

## 2023-11-20 ENCOUNTER — Ambulatory Visit (INDEPENDENT_AMBULATORY_CARE_PROVIDER_SITE_OTHER): Payer: MEDICAID | Admitting: Family Medicine

## 2023-11-20 VITALS — BP 119/75 | HR 85 | Temp 98.7°F | Ht 68.0 in | Wt 148.0 lb

## 2023-11-20 DIAGNOSIS — R0602 Shortness of breath: Secondary | ICD-10-CM | POA: Diagnosis not present

## 2023-11-20 DIAGNOSIS — H9203 Otalgia, bilateral: Secondary | ICD-10-CM

## 2023-11-20 DIAGNOSIS — J989 Respiratory disorder, unspecified: Secondary | ICD-10-CM | POA: Diagnosis not present

## 2023-11-20 DIAGNOSIS — H6993 Unspecified Eustachian tube disorder, bilateral: Secondary | ICD-10-CM

## 2023-11-20 DIAGNOSIS — F3481 Disruptive mood dysregulation disorder: Secondary | ICD-10-CM

## 2023-11-20 MED ORDER — CETIRIZINE HCL 10 MG PO CHEW: 10.0000 mg | CHEWABLE_TABLET | Freq: Every day | ORAL | 0 refills | Status: DC

## 2023-11-20 MED ORDER — ALBUTEROL SULFATE HFA 108 (90 BASE) MCG/ACT IN AERS: 1.0000 | INHALATION_SPRAY | Freq: Four times a day (QID) | RESPIRATORY_TRACT | 0 refills | Status: DC | PRN

## 2023-11-20 MED ORDER — FLUTICASONE PROPIONATE 50 MCG/ACT NA SUSP: 2.0000 | Freq: Every day | NASAL | 6 refills | Status: DC

## 2023-11-20 NOTE — Progress Notes (Signed)
 Subjective:  Patient ID: Pamela Norris, female    DOB: Sep 26, 2000, 22 y.o.   MRN: [id number]  Patient Care Team: Arrie Senate, FNP as PCP - General (Family Medicine)   Chief Complaint:  Ear Pain  HPI: Pamela Norris is a 23 y.o. female presenting on 11/20/2023 for Ear Pain States that she has ear pain. It is waking up her up in the middle of the night and continuous throughtu the day. Mainly in the left ear. Throat started hurting a few days ago as well. She went to UC two days ago and she was given amoxicillin and lidocaine. States that she is still taking amox.. Reports that she has a harder time breathing at night. Endorses rhinorrhea, sneezing, subjective fever. Taking tylenol and aleve. Taking once per day. She has a handwritten note asking for emergency inhaler and nebulizer from [facility]. States that she has shortness of breath mainly at night or with acitivity. Going to asthma and allergy in march.   Recent ED visit  Of note, patient was recently admitted to ED on 11/09/23 for possible intentional overdose. Patient denies SI or active plans of self harm. She states that she does not wish to follow up with [facility]. She has not established with new psychiatry.   Relevant past medical, surgical, family, and social history reviewed and updated as indicated.  Allergies and medications reviewed and updated. Data reviewed: Chart in Epic.   Past Medical History:  Diagnosis Date   ADHD (attention deficit hyperactivity disorder)    Anxiety    Eustachian tube dysfunction 11/11   Headache    OCD (obsessive compulsive disorder)    mild    ODD (oppositional defiant disorder)    some     History reviewed. No pertinent surgical history.  Social History   Socioeconomic History   Marital status: Single    Spouse name: Not on file   Number of children: 0   Years of education: Not on file   Highest education level: 8th grade  Occupational History   Not on file   Tobacco Use   Smoking status: Never   Smokeless tobacco: Never  Vaping Use   Vaping status: Never Used  Substance and Sexual Activity   Alcohol use: Never   Drug use: Never   Sexual activity: Never    Birth control/protection: Injection  Other Topics Concern   Not on file  Social History Narrative   Pamela Norris is a 9th grade student.   She lives with her adoptive parents. She has five siblings.   She enjoys making jewelry,  and giving her dogs baths.   Social Drivers of Health   Financial Resource Strain: Medium Risk (10/28/2023)   Overall Financial Resource Strain (CARDIA)    Difficulty of Paying Living Expenses: Somewhat hard  Food Insecurity: No Food Insecurity (10/28/2023)   Hunger Vital Sign    Worried About Running Out of Food in the Last Year: Never true    Ran Out of Food in the Last Year: Never true  Transportation Needs: Unknown (10/28/2023)   PRAPARE - Administrator, Civil Service (Medical): Not on file    Lack of Transportation (Non-Medical): No  Physical Activity: Sufficiently Active (10/28/2023)   Exercise Vital Sign    Days of Exercise per Week: 7 days    Minutes of Exercise per Session: 150+ min  Stress: Stress Concern Present (10/28/2023)   [employer] of Occupational Health - Occupational Stress Questionnaire  Feeling of Stress : Very much  Social Connections: Unknown (10/28/2023)   Social Connection and Isolation Panel [NHANES]    Frequency of Communication with Friends and Family: More than three times a week    Frequency of Social Gatherings with Friends and Family: More than three times a week    Attends Religious Services: More than 4 times per year    Active Member of [employer] or Organizations: Yes    Attends Banker Meetings: More than 4 times per year    Marital Status: Patient declined  Intimate Partner Violence: Not At Risk (08/23/2023)   Received from [facility]   HITS    Over the last 12 months how often did your  partner physically hurt you?: Never    Over the last 12 months how often did your partner insult you or talk down to you?: Never    Over the last 12 months how often did your partner threaten you with physical harm?: Never    Over the last 12 months how often did your partner scream or curse at you?: Never    Outpatient Encounter Medications as of 11/20/2023  Medication Sig   albuterol (VENTOLIN HFA) 108 (90 Base) MCG/ACT inhaler Inhale into the lungs.   azelastine (ASTELIN) 0.1 % nasal spray Place into the nose.   ibuprofen (ADVIL) 800 MG tablet Take by mouth.   medroxyPROGESTERone (DEPO-PROVERA) 150 MG/ML injection Inject 1 mL (150 mg total) into the muscle every 3 (three) months.   nicotine polacrilex (NICORETTE) 2 MG gum Take 1 each (2 mg total) by mouth as needed for smoking cessation. Maximum:24 pieces/day   ondansetron (ZOFRAN-ODT) 4 MG disintegrating tablet Take 4 mg by mouth every 6 (six) hours.   polyethylene glycol powder (GLYCOLAX/MIRALAX) 17 GM/SCOOP powder Take 17 g by mouth 2 (two) times daily as needed.   vitamin C (ASCORBIC ACID) 250 MG tablet Take 1 tablet (250 mg total) by mouth daily.   carbamazepine (TEGRETOL) 200 MG tablet Take 200 mg by mouth 2 (two) times daily. (Patient not taking: Reported on 11/20/2023)   QUEtiapine (SEROQUEL) 50 MG tablet Take 50 mg by mouth at bedtime. (Patient not taking: Reported on 11/20/2023)   No facility-administered encounter medications on file as of 11/20/2023.    Allergies  Allergen Reactions   Banana Itching   Trazodone And Nefazodone Itching    Review of Systems As per HPI  Objective:  BP 119/75   Pulse 85   Temp 98.7 F (37.1 C)   Ht 5\' 8"  (1.727 m)   Wt 148 lb (67.1 kg)   SpO2 96%   BMI 22.50 kg/m    Wt Readings from Last 3 Encounters:  11/20/23 148 lb (67.1 kg)  10/15/23 147 lb (66.7 kg)  09/03/23 143 lb (64.9 kg)   Physical Exam Constitutional:      General: She is awake. She is not in acute distress.     Appearance: Normal appearance. She is well-developed and well-groomed. She is not ill-appearing, toxic-appearing or diaphoretic.  Cardiovascular:     Rate and Rhythm: Normal rate and regular rhythm.     Pulses: Normal pulses.          Radial pulses are 2+ on the right side and 2+ on the left side.       Posterior tibial pulses are 2+ on the right side and 2+ on the left side.     Heart sounds: Normal heart sounds. No murmur heard.    No  gallop.  Pulmonary:     Effort: Pulmonary effort is normal. No respiratory distress.     Breath sounds: Normal breath sounds. No stridor. No wheezing, rhonchi or rales.  Musculoskeletal:     Cervical back: Full passive range of motion without pain and neck supple.     Right lower leg: No edema.     Left lower leg: No edema.  Skin:    General: Skin is warm.     Capillary Refill: Capillary refill takes less than 2 seconds.  Neurological:     General: No focal deficit present.     Mental Status: She is alert, oriented to person, place, and time and easily aroused. Mental status is at baseline.     GCS: GCS eye subscore is 4. GCS verbal subscore is 5. GCS motor subscore is 6.     Motor: No weakness.  Psychiatric:        Attention and Perception: Perception normal. She is inattentive.        Mood and Affect: Mood is anxious. Affect is labile.        Speech: Speech is rapid and pressured and tangential.        Behavior: Behavior is hyperactive. Behavior is cooperative.        Thought Content: Thought content normal. Thought content does not include homicidal or suicidal ideation. Thought content does not include homicidal or suicidal plan.        Cognition and Memory: Cognition and memory normal.        Judgment: Judgment is impulsive and inappropriate.    Results for orders placed or performed in visit on 10/15/23  Bayer DCA Hb A1c Waived   Collection Time: 10/15/23  4:09 PM  Result Value Ref Range   HB A1C (BAYER DCA - WAIVED) 5.2 4.8 - 5.6 %   CMP14+EGFR   Collection Time: 10/15/23  4:11 PM  Result Value Ref Range   Glucose 86 70 - 99 mg/dL   BUN 15 6 - 20 mg/dL   Creatinine, Ser 0.96 (L) 0.57 - 1.00 mg/dL   eGFR 045 >40 JW/JXB/1.47   BUN/Creatinine Ratio 33 (H) 9 - 23   Sodium 145 (H) 134 - 144 mmol/L   Potassium 4.0 3.5 - 5.2 mmol/L   Chloride 108 (H) 96 - 106 mmol/L   CO2 20 20 - 29 mmol/L   Calcium 9.4 8.7 - 10.2 mg/dL   Total Protein 7.5 6.0 - 8.5 g/dL   Albumin 4.5 4.0 - 5.0 g/dL   Globulin, Total 3.0 1.5 - 4.5 g/dL   Bilirubin Total <8.2 0.0 - 1.2 mg/dL   Alkaline Phosphatase 103 42 - 106 IU/L   AST 19 0 - 40 IU/L   ALT 22 0 - 32 IU/L       11/20/2023    1:22 PM 10/15/2023    3:56 PM 09/03/2023    2:00 PM 04/10/2022   11:07 AM 08/27/2021   10:41 AM  Depression screen PHQ 2/9  Decreased Interest 0 0 0 0 1  Down, Depressed, Hopeless 1 1 1  0 2  PHQ - 2 Score 1 1 1  0 3  Altered sleeping 3 3  3 1   Tired, decreased energy 1 2  0 1  Change in appetite 2 2  1 3   Feeling bad or failure about yourself  3 3  0 3  Trouble concentrating 3 3  0 2  Moving slowly or fidgety/restless  3  3 3   Suicidal thoughts 0  1  0 0  PHQ-9 Score 13 18  7 16   Difficult doing work/chores Not difficult at all Not difficult at all  Somewhat difficult Extremely dIfficult       11/20/2023    1:22 PM 10/15/2023    3:55 PM 09/03/2023    2:00 PM 04/10/2022   11:08 AM  GAD 7 : Generalized Anxiety Score  Nervous, Anxious, on Edge 3 3 3 3   Control/stop worrying 3 3 3 3   Worry too much - different things 3 3 3 3   Trouble relaxing 3 3 2 3   Restless 3 3 3 3   Easily annoyed or irritable 3 3 3 3   Afraid - awful might happen 3 3 0 2  Total GAD 7 Score 21 21 17 20   Anxiety Difficulty Somewhat difficult Not difficult at all Not difficult at all Very difficult      Pertinent labs & imaging results that were available during my care of the patient were reviewed by me and considered in my medical decision making.  Assessment & Plan:  Catrice  was seen today for ear pain.  Diagnoses and all orders for this visit:  1. Respiratory illness (Primary) Encouraged patient to continue abx. Can continue to take tylenol and ibuprofen for as needed pain. Will start medications as below to help with symptom management.  - fluticasone (FLONASE) 50 MCG/ACT nasal spray; Place 2 sprays into both nostrils daily.  Dispense: 16 g; Refill: 6 - cetirizine (ZYRTEC CHILDRENS ALLERGY) 10 MG chewable tablet; Chew 1 tablet (10 mg total) by mouth daily.  Dispense: 90 tablet; Refill: 0  2. Ear pain, bilateral As above.  - fluticasone (FLONASE) 50 MCG/ACT nasal spray; Place 2 sprays into both nostrils daily.  Dispense: 16 g; Refill: 6 - cetirizine (ZYRTEC CHILDRENS ALLERGY) 10 MG chewable tablet; Chew 1 tablet (10 mg total) by mouth daily.  Dispense: 90 tablet; Refill: 0  3. Dysfunction of both eustachian tubes As above.  - fluticasone (FLONASE) 50 MCG/ACT nasal spray; Place 2 sprays into both nostrils daily.  Dispense: 16 g; Refill: 6 - cetirizine (ZYRTEC CHILDRENS ALLERGY) 10 MG chewable tablet; Chew 1 tablet (10 mg total) by mouth daily.  Dispense: 90 tablet; Refill: 0  4. Shortness of breath Will send in medication as below until patient can be formally evaluated by asthma and allergy.  - albuterol (VENTOLIN HFA) 108 (90 Base) MCG/ACT inhaler; Inhale 1 puff into the lungs every 6 (six) hours as needed for wheezing or shortness of breath.  Dispense: 8 g; Refill: 0  5. Disruptive mood dysregulation disorder (HCC) Denies SI. Denies active plans. Safety contract established. Patient to follow up with psychiatry. Provided patient with contact information. Provided patient with information for [facility] urgent cares if needed.   Continue all other maintenance medications.  Follow up plan: Return in about 6 months (around 05/22/2024) for Chronic Condition Follow up.   Continue healthy lifestyle choices, including diet (rich in fruits, vegetables, and lean  proteins, and low in salt and simple carbohydrates) and exercise (at least 30 minutes of moderate physical activity daily).  Written and verbal instructions provided   The above assessment and management plan was discussed with the patient. The patient verbalized understanding of and has agreed to the management plan. Patient is aware to call the clinic if they develop any new symptoms or if symptoms persist or worsen. Patient is aware when to return to the clinic for a follow-up visit. Patient educated on when it is appropriate to  go to the emergency department.   Neale Burly, DNP-FNP [facility] [address] [place], [place] [address] [phone]

## 2023-11-20 NOTE — Patient Instructions (Addendum)
 [facility] at [facility]. [address], [place] [address] [phone]  [facility] [address] [place] [phone]   [facility]  Phone: [phone] Address: [address] [place], [place] [address] Hours:Open 24/7, No appointment required.  The [facility] ([facility]) is centrally located in the [place] at [address], [place]

## 2023-11-24 ENCOUNTER — Telehealth: Payer: Self-pay

## 2023-11-24 ENCOUNTER — Other Ambulatory Visit (HOSPITAL_COMMUNITY): Payer: Self-pay

## 2023-11-24 NOTE — Telephone Encounter (Signed)
 Pharmacy Patient Advocate Encounter   Received notification from Onbase that prior authorization for Cetirizine HCl 10MG  chewable tablets is required/requested.   Insurance verification completed.   The patient is insured through [employer] [place] .   Per test claim: Medication is not covered, no PA submitted at this time

## 2023-11-29 ENCOUNTER — Telehealth: Payer: MEDICAID | Admitting: Physician Assistant

## 2023-11-29 DIAGNOSIS — N760 Acute vaginitis: Secondary | ICD-10-CM

## 2023-11-29 NOTE — Progress Notes (Signed)
 Virtual Visit Consent   Pamela Norris, you are scheduled for a virtual visit with a [facility] provider today. Just as with appointments in the office, your consent must be obtained to participate. Your consent will be active for this visit and any virtual visit you may have with one of our providers in the next 365 days. If you have a MyChart account, a copy of this consent can be sent to you electronically.  As this is a virtual visit, video technology does not allow for your provider to perform a traditional examination. This may limit your provider's ability to fully assess your condition. If your provider identifies any concerns that need to be evaluated in person or the need to arrange testing (such as labs, EKG, etc.), we will make arrangements to do so. Although advances in technology are sophisticated, we cannot ensure that it will always work on either your end or our end. If the connection with a video visit is poor, the visit may have to be switched to a telephone visit. With either a video or telephone visit, we are not always able to ensure that we have a secure connection.  By engaging in this virtual visit, you consent to the provision of healthcare and authorize for your insurance to be billed (if applicable) for the services provided during this visit. Depending on your insurance coverage, you may receive a charge related to this service.  I need to obtain your verbal consent now. Are you willing to proceed with your visit today? Pamela Norris has provided verbal consent on 11/29/2023 for a virtual visit (video or telephone). Laure Kidney, [place]  Date: 11/29/2023 7:12 PM   Virtual Visit via Video Note   I, Laure Kidney, connected with  Pamela Norris  ([id number], 2000/12/04) on 11/29/23 at  7:00 PM EDT by a video-enabled telemedicine application and verified that I am speaking with the correct person using two identifiers.  Location: Patient: Virtual Visit Location  Patient: Home Provider: Virtual Visit Location Provider: Home Office   I discussed the limitations of evaluation and management by telemedicine and the availability of in person appointments. The patient expressed understanding and agreed to proceed.    History of Present Illness: Pamela Norris is a 22 y.o. who identifies as a female who was assigned female at birth, and is being seen today for vaginal discharge.  HPI: Vaginal Discharge The patient's primary symptoms include vaginal discharge. This is a new problem. The current episode started in the past 7 days. The problem occurs constantly. The problem has been unchanged. The patient is experiencing no pain. Associated symptoms include dysuria. Pertinent negatives include no abdominal pain. The vaginal discharge was white. There has been no bleeding. She has not been passing clots. She has not been passing tissue. She has tried nothing for the symptoms. The treatment provided no relief. She is sexually active.    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/06/2021   Adjustment disorder with mixed anxiety and depressed mood 10/06/2021   DMDD (disruptive mood dysregulation disorder) (HCC) 11/18/2020   Bilateral temporomandibular joint pain 09/15/2020   Viral pharyngitis 07/12/2020   Conduct disorder 07/13/2019   Oppositional defiant disorder    Disruptive mood dysregulation disorder (HCC) 11/08/2015   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 (Patient not taking: Reported on 11/20/2023), Disp: , Rfl:    cetirizine (ZYRTEC CHILDRENS ALLERGY) 10 MG chewable tablet, Chew 1 tablet (10 mg total) by mouth daily., Disp: 90 tablet, Rfl: 0   fluticasone (FLONASE) 50 MCG/ACT nasal spray, Place 2 sprays into both nostrils daily., Disp: 16 g, Rfl: 6   ibuprofen (ADVIL) 800 MG tablet, Take by mouth., Disp: , Rfl:    medroxyPROGESTERone (DEPO-PROVERA) 150 MG/ML injection, Inject 1 mL (150 mg total) into the muscle every 3 (three) months., Disp: 1 mL, Rfl: 3   nicotine polacrilex (NICORETTE) 2 MG gum, Take 1 each (2 mg total) by mouth as needed for smoking cessation. Maximum:24 pieces/day, Disp: 100 tablet, Rfl: 0   ondansetron (ZOFRAN-ODT) 4 MG disintegrating tablet, Take 4 mg by mouth every 6 (six) hours., Disp: , Rfl:    polyethylene glycol powder (GLYCOLAX/MIRALAX) 17 GM/SCOOP powder, Take 17 g by mouth 2 (two) times daily as needed., Disp: 3350 g, Rfl: 1   QUEtiapine (SEROQUEL) 50 MG tablet, Take 50 mg by mouth at bedtime. (Patient not taking: Reported on 11/20/2023), Disp: , Rfl:    vitamin C (ASCORBIC ACID) 250 MG tablet, Take 1 tablet (250 mg total) by mouth daily., Disp: 30 tablet, Rfl: 0  Observations/Objective: Patient is well-developed, well-nourished in no acute distress.  Resting comfortably  at home.  Head is normocephalic, atraumatic.  No labored breathing.  Speech is clear and coherent with logical content.  Patient is alert and oriented at baseline.    Assessment and Plan: 1. Acute vaginitis (Primary)  Patient with active discharge and 2 episodes of being on antibiotics not appropriate for telehatlh and needs vaginal exam. Advised to report  to ER or UC for in person visit.  Follow Up Instructions: I discussed the assessment and treatment plan with the patient. The patient was provided an opportunity to ask questions and all were answered. The patient agreed with the plan and demonstrated an understanding of the instructions.  A copy of instructions were sent to the patient via MyChart unless otherwise noted below.    The patient was advised to call back or seek an in-person evaluation if the symptoms worsen or if the condition fails to improve as anticipated.    Laure Kidney, PA-C

## 2023-11-29 NOTE — Patient Instructions (Addendum)
  Pamela Norris, thank you for joining Laure Kidney, PA-C for today's virtual visit.  While this provider is not your primary care provider (PCP), if your PCP is located in our provider database this encounter information will be shared with them immediately following your visit.   A [facility] MyChart account gives you access to today's visit and all your visits, tests, and labs performed at [facility] " click here if you don't have a [facility] MyChart account or go to [email or web address]  Consent: (Patient) [name] provided verbal consent for this virtual visit at the beginning of the encounter.  Current Medications:  Current Outpatient Medications:    albuterol (VENTOLIN HFA) 108 (90 Base) MCG/ACT inhaler, Inhale 1 puff into the lungs every 6 (six) hours as needed for wheezing or shortness of breath., Disp: 8 g, Rfl: 0   azelastine (ASTELIN) 0.1 % nasal spray, Place into the nose., Disp: , Rfl:    carbamazepine (TEGRETOL) 200 MG tablet, Take 200 mg by mouth 2 (two) times daily. (Patient not taking: Reported on 11/20/2023), Disp: , Rfl:    cetirizine (ZYRTEC CHILDRENS ALLERGY) 10 MG chewable tablet, Chew 1 tablet (10 mg total) by mouth daily., Disp: 90 tablet, Rfl: 0   fluticasone (FLONASE) 50 MCG/ACT nasal spray, Place 2 sprays into both nostrils daily., Disp: 16 g, Rfl: 6   ibuprofen (ADVIL) 800 MG tablet, Take by mouth., Disp: , Rfl:    medroxyPROGESTERone (DEPO-PROVERA) 150 MG/ML injection, Inject 1 mL (150 mg total) into the muscle every 3 (three) months., Disp: 1 mL, Rfl: 3   nicotine polacrilex (NICORETTE) 2 MG gum, Take 1 each (2 mg total) by mouth as needed for smoking cessation. Maximum:24 pieces/day, Disp: 100 tablet, Rfl: 0   ondansetron (ZOFRAN-ODT) 4 MG disintegrating tablet, Take 4 mg by mouth every 6 (six) hours., Disp: , Rfl:    polyethylene glycol powder (GLYCOLAX/MIRALAX) 17 GM/SCOOP powder, Take 17 g by mouth 2 (two) times daily as needed.,  Disp: 3350 g, Rfl: 1   QUEtiapine (SEROQUEL) 50 MG tablet, Take 50 mg by mouth at bedtime. (Patient not taking: Reported on 11/20/2023), Disp: , Rfl:    vitamin C (ASCORBIC ACID) 250 MG tablet, Take 1 tablet (250 mg total) by mouth daily., Disp: 30 tablet, Rfl: 0   Medications ordered in this encounter:  No orders of the defined types were placed in this encounter.    *If you need refills on other medications prior to your next appointment, please contact your pharmacy*  Follow-Up: Call back or seek an in-person evaluation if the symptoms worsen or if the condition fails to improve as anticipated.  [facility] [phone]  Other Instructions Please report to the nearest Emergency room for evaluation   If you have been instructed to have an in-person evaluation today at a local Urgent Care facility, please use the link below. It will take you to a list of all of our available [facility], including address, phone number and hours of operation. Please do not delay care.  [facility]  If you or a family member do not have a primary care provider, use the link below to schedule a visit and establish care. When you choose a [facility] primary care physician or advanced practice provider, you gain a long-term partner in health. Find a Primary Care Provider  Learn more about [facility]'s in-office and virtual care options:  - Get Care Now

## 2023-12-03 ENCOUNTER — Other Ambulatory Visit: Payer: Self-pay

## 2023-12-03 ENCOUNTER — Encounter: Payer: Self-pay | Admitting: Allergy

## 2023-12-03 ENCOUNTER — Ambulatory Visit (INDEPENDENT_AMBULATORY_CARE_PROVIDER_SITE_OTHER): Payer: MEDICAID | Admitting: Allergy

## 2023-12-03 VITALS — BP 106/68 | HR 78 | Temp 98.6°F | Ht 68.0 in | Wt 148.5 lb

## 2023-12-03 DIAGNOSIS — T781XXD Other adverse food reactions, not elsewhere classified, subsequent encounter: Secondary | ICD-10-CM

## 2023-12-03 DIAGNOSIS — J310 Chronic rhinitis: Secondary | ICD-10-CM

## 2023-12-03 DIAGNOSIS — L309 Dermatitis, unspecified: Secondary | ICD-10-CM | POA: Diagnosis not present

## 2023-12-03 DIAGNOSIS — R0683 Snoring: Secondary | ICD-10-CM

## 2023-12-03 DIAGNOSIS — J4540 Moderate persistent asthma, uncomplicated: Secondary | ICD-10-CM

## 2023-12-03 DIAGNOSIS — H1013 Acute atopic conjunctivitis, bilateral: Secondary | ICD-10-CM

## 2023-12-03 DIAGNOSIS — H109 Unspecified conjunctivitis: Secondary | ICD-10-CM

## 2023-12-03 MED ORDER — BUDESONIDE-FORMOTEROL FUMARATE 160-4.5 MCG/ACT IN AERO: 2.0000 | INHALATION_SPRAY | Freq: Two times a day (BID) | RESPIRATORY_TRACT | 5 refills | Status: DC

## 2023-12-03 MED ORDER — LEVOCETIRIZINE DIHYDROCHLORIDE 5 MG PO TABS: 5.0000 mg | ORAL_TABLET | Freq: Every day | ORAL | 5 refills | Status: DC

## 2023-12-03 MED ORDER — ALBUTEROL SULFATE HFA 108 (90 BASE) MCG/ACT IN AERS: 1.0000 | INHALATION_SPRAY | Freq: Four times a day (QID) | RESPIRATORY_TRACT | 1 refills | Status: DC | PRN

## 2023-12-03 MED ORDER — TACROLIMUS 0.1 % EX OINT: TOPICAL_OINTMENT | Freq: Two times a day (BID) | CUTANEOUS | 0 refills | Status: DC | PRN

## 2023-12-03 MED ORDER — AZELASTINE-FLUTICASONE 137-50 MCG/ACT NA SUSP: 1.0000 | Freq: Every day | NASAL | 5 refills | Status: DC

## 2023-12-03 NOTE — Progress Notes (Addendum)
 New Patient Note  RE: CAROLYNA YERIAN MRN: [id number] DOB: Apr 17, 2001 Date of Office Visit: 12/03/2023  Primary care provider: Arrie Senate, FNP  Chief Complaint: allergies and asthma  History of present illness: Pamela Norris is a 23 y.o. female presenting today for evaluation of asthma, allergic rhinitis.   Discussed the use of AI scribe software for clinical note transcription with the patient, who gave verbal consent to proceed.  She has a history of asthma diagnosed while in prison, with worsening symptoms over the past month. She experiences lightheadedness and dizziness when walking uphill, and her partner has noted episodes of apnea during sleep. She uses an albuterol inhaler once daily but has not been hospitalized or required steroids for asthma exacerbations. No other asthma medications are used besides the albuterol inhaler. She has coughing, occasional wheezing, and frequent chest tightness related to her asthma.   She experiences year-round environmental allergy symptoms, including rhinorrhea, nasal congestion, sneezing, and itchy, watery eyes. Previous allergy testing was negative, but she was advised to repeat the testing; this was done when she was living in [place], [place].  She never went back for testing and she moved this this area. She uses Flonase and astelastine nasal sprays, which provide minimal relief, and has been unable to obtain Zyrtec due to a prescription issue. Cromolyn eye drops prescribed by her eye doctor help slightly with her symptoms.  She has a history of eczema, primarily affecting her face and back, and has not used any prescription ointments for it.  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  She mentions having large tonsils, which were supposed to be removed during childhood but she went into [employer] custody before they would schedule and perform the procedure  and so they were never removed, and now she experiences positional snoring and apnea symptoms, such as waking up frequently at night and being told she stops breathing during sleep.      Review of systems: 10pt ROS negative unless noted above in HPI  Past medical history: Past Medical History:  Diagnosis Date   ADHD (attention deficit hyperactivity disorder)    Anxiety    Asthma    Eustachian tube dysfunction 07/2010   Headache    OCD (obsessive compulsive disorder)    mild    ODD (oppositional defiant disorder)    some     Past surgical history: Past Surgical History:  Procedure Laterality Date   TYMPANOSTOMY TUBE PLACEMENT      Family history:  Family History  Adopted: Yes  Problem Relation Age of Onset   Asthma Mother    Alcohol abuse Mother    Drug abuse Mother    Asthma Father    Alcohol abuse Father    Drug abuse Father    Asthma Paternal Aunt     Social history: Lives in a dwelling without carpeting with electric heating and central cooling.  Dogs in the home.  There is no concern for water damage, mildew or roaches in the home.  She does not report an occupation at this time.  She does smoke and states her start date was in 2024.   Medication List: Current Outpatient Medications  Medication Sig Dispense Refill   albuterol (VENTOLIN HFA) 108 (90 Base) MCG/ACT inhaler Inhale 1 puff into the lungs every 6 (six) hours as needed for wheezing or shortness of breath. 8 g 0   cromolyn (OPTICROM) 4 %  ophthalmic solution Place 1 drop into both eyes as needed.     ibuprofen (ADVIL) 800 MG tablet Take by mouth.     medroxyPROGESTERone (DEPO-PROVERA) 150 MG/ML injection Inject 1 mL (150 mg total) into the muscle every 3 (three) months. 1 mL 3   vitamin C (ASCORBIC ACID) 250 MG tablet Take 1 tablet (250 mg total) by mouth daily. 30 tablet 0   azelastine (ASTELIN) 0.1 % nasal spray Place into the nose. (Patient not taking: Reported on 12/03/2023)     carbamazepine  (TEGRETOL) 200 MG tablet Take 200 mg by mouth 2 (two) times daily. (Patient not taking: Reported on 12/03/2023)     cetirizine (ZYRTEC CHILDRENS ALLERGY) 10 MG chewable tablet Chew 1 tablet (10 mg total) by mouth daily. (Patient not taking: Reported on 12/03/2023) 90 tablet 0   fluticasone (FLONASE) 50 MCG/ACT nasal spray Place 2 sprays into both nostrils daily. (Patient not taking: Reported on 12/03/2023) 16 g 6   nicotine polacrilex (NICORETTE) 2 MG gum Take 1 each (2 mg total) by mouth as needed for smoking cessation. Maximum:24 pieces/day (Patient not taking: Reported on 12/03/2023) 100 tablet 0   ondansetron (ZOFRAN-ODT) 4 MG disintegrating tablet Take 4 mg by mouth every 6 (six) hours. (Patient not taking: Reported on 12/03/2023)     polyethylene glycol powder (GLYCOLAX/MIRALAX) 17 GM/SCOOP powder Take 17 g by mouth 2 (two) times daily as needed. 3350 g 1   QUEtiapine (SEROQUEL) 50 MG tablet Take 50 mg by mouth at bedtime. (Patient not taking: Reported on 12/03/2023)     No current facility-administered medications for this visit.    Known medication allergies: Allergies  Allergen Reactions   Banana Itching   Trazodone And Nefazodone Itching     Physical examination: Blood pressure 106/68, pulse 78, temperature 98.6 F (37 C), temperature source Temporal, height 5\' 8"  (1.727 m), weight 148 lb 8 oz (67.4 kg), SpO2 97%.  General: Alert, interactive, in no acute distress. HEENT: PERRLA, TMs pearly gray, turbinates minimally edematous without discharge, post-pharynx non erythematous, tonsils 2+. Neck: Supple without lymphadenopathy. Lungs: Clear to auscultation without wheezing, rhonchi or rales. {no increased work of breathing. CV: Normal S1, S2 without murmurs. Abdomen: Nondistended, nontender. Skin: Warm and dry, without lesions or rashes. Extremities:  No clubbing, cyanosis or edema. Neuro:   Grossly intact.  Diagnositics/Labs: Labs:  WBC 4.0 - 10.5 10*9/L 6.6  RBC 3.80 - 5.10  10*12/L 4.08  HGB 11.5 - 15.0 g/dL 21.3  HCT 08.6 - 57.8 % 34.4  MCV 80.0 - 98.0 fL 84.3  MCH 27.0 - 34.0 pg 28.2  MCHC 32.0 - 36.0 g/dL 46.9  RDW 62.9 - 52.8 % 13.4  MPV 7.4 - 10.4 fL 9.9  Platelet 140 - 415 10*9/L 310  Neutrophils % % 53.7  Lymphocytes % % 33.6  Monocytes % % 10.6  Eosinophils % % 1.2  Basophils % % 0.6  Absolute Neutrophils 1.8 - 7.8 10*9/L 3.6  Absolute Lymphocytes 0.7 - 4.5 10*9/L 2.2  Absolute Monocytes 0.1 - 1.0 10*9/L 0.7  Absolute Eosinophils 0.0 - 0.4 10*9/L 0.1  Absolute Basophils 0.0 - 0.2 10*9/L 0    Spirometry: FEV1: 2.56L 68%, FVC: 2.71L 62% predicted.  Status post albuterol she did not have a significant response in FEV1 did not improve  Assessment and plan: Asthma Asthma with poor control, requiring daily albuterol.   - Spacer sample and demonstration provided. - Daily controller medication(s): Symbicort 160/4.60mcg two puffs 2 times daily with spacer device -  Prior to physical activity: albuterol 2 puffs 10-15 minutes before physical activity. - Rescue medications: albuterol 2 puffs every 4-6 hours as needed  - Asthma control goals:  * Full participation in all desired activities (may need albuterol before activity) * Albuterol use two time or less a week on average (not counting use with activity) * Cough interfering with sleep two time or less a month * Oral steroids no more than once a year * No hospitalizations   Environmental Allergies Perennial allergies with limited relief from current medications. Allergy testing deferred until asthma control improves. Skin testing preferred. - Schedule skin testing for environmental allergies and hold antihistamines for 3 days prior to this visit.  - Use Xyzal 5mg  daily.  This is a antihistamine. - Continue Cromolyn eye drop 1 drop each eye up to 4 times a day as needed for itchy/watery eyes - Use Dymista nasal spray 1 spray each nostril twice a day for runny or stuffy nose.   This is a combination spray with both Flonase and Astelin in the same bottle.   With using nasal sprays point tip of bottle toward eye on same side nostril and lean head slightly forward for best technique.   - Use saline rinse kit prior to nasal spray use.  Use distilled water or boil water and bring to room temperature (do not use tap water).   Eczema Eczema on face and back, no previous prescription treatments. - Use Protopic ointment twice a day as needed for rash on face or another areas of body.  This is a non-steroid ointment safe to use anywhere on body.   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 - Most likely due to pollen allergy.  Will determine this with skin testing.  - Continue avoidance  Snoring Suspected sleep apnea due to tonsillar hypertrophy and symptoms of apnea. Discussed tonsillectomy benefits and challenges. Sleep study needed. - Order sleep study to evaluate for sleep apnea.  Schedule skin testing visit and hold all antihistamines for 3 days prior to this visit.    (Env 1-55 + banana)  Routine follow-up visit in 3-4 months  I appreciate the opportunity to take part in Kyannah's care. Please do not hesitate to contact me with questions.  Sincerely,   Margo Aye, MD Allergy/Immunology [facility]

## 2023-12-03 NOTE — Patient Instructions (Addendum)
 Asthma Asthma with poor control, requiring daily albuterol.   - Spacer sample and demonstration provided. - Daily controller medication(s): Symbicort 160/4.27mcg two puffs 2 times daily with spacer device - Prior to physical activity: albuterol 2 puffs 10-15 minutes before physical activity. - Rescue medications: albuterol 2 puffs every 4-6 hours as needed  - Asthma control goals:  * Full participation in all desired activities (may need albuterol before activity) * Albuterol use two time or less a week on average (not counting use with activity) * Cough interfering with sleep two time or less a month * Oral steroids no more than once a year * No hospitalizations   Environmental Allergies Perennial allergies with limited relief from current medications. Allergy testing deferred until asthma control improves. Skin testing preferred. - Schedule skin testing for environmental allergies and hold antihistamines for 3 days prior to this visit.  - Use Xyzal 5mg  daily.  This is a antihistamine. - Continue Cromolyn eye drop 1 drop each eye up to 4 times a day as needed for itchy/watery eyes - Use Dymista nasal spray 1 spray each nostril twice a day for runny or stuffy nose.  This is a combination spray with both Flonase and Astelin in the same bottle.   With using nasal sprays point tip of bottle toward eye on same side nostril and lean head slightly forward for best technique.   - Use saline rinse kit prior to nasal spray use.  Use distilled water or boil water and bring to room temperature (do not use tap water).   Eczema Eczema on face and back, no previous prescription treatments. - Use Protopic ointment twice a day as needed for rash on face or another areas of body.  This is a non-steroid ointment safe to use anywhere on body.   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 - Most likely due to pollen allergy.  Will determine this with skin testing.  - Continue  avoidance  Snoring Suspected sleep apnea due to tonsillar hypertrophy and symptoms of apnea. Discussed tonsillectomy benefits and challenges. Sleep study needed. - Order sleep study to evaluate for sleep apnea.  Schedule skin testing visit and hold all antihistamines for 3 days prior to this visit.   Routine follow-up visit in 3-4 months

## 2023-12-04 ENCOUNTER — Telehealth: Payer: Self-pay

## 2023-12-04 NOTE — Telephone Encounter (Signed)
*  Asthma/Allergy  Pharmacy Patient Advocate Encounter   Received notification from CoverMyMeds that prior authorization for Tacrolimus 0.1% ointment  is required/requested.   Insurance verification completed.   The patient is insured through [employer] .   Per test claim: PA required; PA submitted to above mentioned insurance via CoverMyMeds Key/confirmation #/EOC [id number] Status is pending

## 2023-12-07 ENCOUNTER — Other Ambulatory Visit (HOSPITAL_COMMUNITY): Payer: Self-pay

## 2023-12-07 ENCOUNTER — Other Ambulatory Visit: Payer: Self-pay

## 2023-12-07 DIAGNOSIS — R0683 Snoring: Secondary | ICD-10-CM

## 2023-12-07 NOTE — Addendum Note (Signed)
 Addended by: Areta Haber B on: 12/07/2023 01:56 PM   Modules accepted: Orders

## 2023-12-07 NOTE — Telephone Encounter (Signed)
 Pharmacy Patient Advocate Encounter  Received notification from [facility] that Prior Authorization for Tacrolimus 0.1% ointment  has been APPROVED from 12/04/2023 to 12/02/2024. Ran test claim, Copay is $0.00. This test claim was processed through [facility]- copay amounts may vary at other pharmacies due to pharmacy/plan contracts, or as the patient moves through the different stages of their insurance plan.

## 2023-12-18 ENCOUNTER — Telehealth: Payer: Self-pay | Admitting: Allergy

## 2023-12-18 NOTE — Telephone Encounter (Signed)
 Patient called and stated she was unable to do her allergy testing for the first visit as she stated Dr.Padgett informed her since she difficulty breathing that she may have an asthma attack. Patient called and stated her breathing is actually getting worse and was asking if she should still keep her appointment for April 10th for the skin testing.  Patient would like a call back.  Best Contact: [phone]

## 2023-12-21 NOTE — Telephone Encounter (Signed)
 I called patient and she thinks the pollen has been affecting her breathing. Her breathing has gotten better as she has not been walking as much. She has not used her albuterol and is still using her Symbicort daily. I informed of Dr.Padgett's message.

## 2023-12-24 ENCOUNTER — Ambulatory Visit: Payer: MEDICAID | Admitting: Allergy

## 2023-12-28 ENCOUNTER — Ambulatory Visit: Payer: MEDICAID | Attending: Family Medicine | Admitting: Physical Therapy

## 2023-12-28 ENCOUNTER — Telehealth: Payer: Self-pay | Admitting: Physical Therapy

## 2023-12-28 NOTE — Telephone Encounter (Signed)
 Called pt re missed PT eval- someone at her cell number said she does not live there anymore and they don't know how to get in touch with her

## 2023-12-29 ENCOUNTER — Ambulatory Visit: Payer: MEDICAID | Admitting: Allergy

## 2024-01-06 ENCOUNTER — Ambulatory Visit: Payer: MEDICAID | Admitting: Allergy

## 2024-01-06 ENCOUNTER — Ambulatory Visit: Payer: MEDICAID | Admitting: Neurology

## 2024-01-06 ENCOUNTER — Encounter: Payer: Self-pay | Admitting: Allergy

## 2024-01-06 ENCOUNTER — Telehealth: Payer: Self-pay | Admitting: Neurology

## 2024-01-06 DIAGNOSIS — T7819XD Other adverse food reactions, not elsewhere classified, subsequent encounter: Secondary | ICD-10-CM

## 2024-01-06 DIAGNOSIS — T781XXD Other adverse food reactions, not elsewhere classified, subsequent encounter: Secondary | ICD-10-CM | POA: Diagnosis not present

## 2024-01-06 DIAGNOSIS — J302 Other seasonal allergic rhinitis: Secondary | ICD-10-CM | POA: Diagnosis not present

## 2024-01-06 DIAGNOSIS — H1013 Acute atopic conjunctivitis, bilateral: Secondary | ICD-10-CM | POA: Diagnosis not present

## 2024-01-06 DIAGNOSIS — J3089 Other allergic rhinitis: Secondary | ICD-10-CM

## 2024-01-06 NOTE — Patient Instructions (Signed)
 Asthma - Daily controller medication(s): Symbicort  160/4.15mcg two puffs 2 times daily with spacer device - Prior to physical activity: albuterol  2 puffs 10-15 minutes before physical activity. - Rescue medications: albuterol  2 puffs every 4-6 hours as needed  - Asthma control goals:  * Full participation in all desired activities (may need albuterol  before activity) * Albuterol  use two time or less a week on average (not counting use with activity) * Cough interfering with sleep two time or less a month * Oral steroids no more than once a year * No hospitalizations  Allergic rhinitis with conjunctivitis - Testing today showed: grasses, weeds, trees, indoor molds, and outdoor molds - Copy of test results provided.  - Avoidance measures provided. - Use Xyzal  5mg  daily.  This is a antihistamine.  You can use an extra dose of the antihistamine, if needed, for breakthrough symptoms.  - Continue Cromolyn eye drop 1 drop each eye up to 4 times a day as needed for itchy/watery eyes - Use Dymista  nasal spray 1 spray each nostril twice a day for runny or stuffy nose.  This is a combination spray with both Flonase  and Astelin  in the same bottle.   With using nasal sprays point tip of bottle toward eye on same side nostril and lean head slightly forward for best technique.   - Use saline rinse kit prior to nasal spray use.  Use distilled water  or boil water  and bring to room temperature (do not use tap water ).  - Consider allergy  shots as a means of long-term control. - Allergy  shots "re-train" and "reset" the immune system to ignore environmental allergens and decrease the resulting immune response to those allergens (sneezing, itchy watery eyes, runny nose, nasal congestion, etc).    - Allergy  shots improve symptoms in 75-85% of patients.  - We can discuss more at a future appointment if the medications are not working for you.  Eczema Eczema on face and back, no previous prescription treatments. -  Use Protopic  ointment twice a day as needed for rash on face or another areas of body.  This is a non-steroid ointment safe to use anywhere on body.   Banana Allergy  Allergy  to bananas causing oral burning sensation. - Testing is positive to banana - Continue avoidance  Snoring Suspected sleep apnea due to tonsillar hypertrophy and symptoms of apnea. Discussed tonsillectomy benefits and challenges. Sleep study needed. - Order sleep study to evaluate for sleep apnea.   Routine follow-up visit in 3-4 months

## 2024-01-06 NOTE — Telephone Encounter (Signed)
 r/s appointment due to a conflict with transportation

## 2024-01-06 NOTE — Progress Notes (Signed)
 Follow-up Note  RE: RAYN SHORB MRN: [id number] DOB: 05-May-2001 Date of Office Visit: 01/06/2024   History of present illness: Pamela Norris is a 23 y.o. female presenting today for skin testing.  She was last seen in the office on asthma, rhinoconjunctivitis, eczema, banana allergy , snoring.  She is in her usual state of health today without recent illness.  She has held antihistamines for at least 3 days for testing today.   Medication List: Current Outpatient Medications  Medication Sig Dispense Refill   albuterol  (VENTOLIN  HFA) 108 (90 Base) MCG/ACT inhaler Inhale 1 puff into the lungs every 6 (six) hours as needed for wheezing or shortness of breath. 8 g 1   Azelastine -Fluticasone  137-50 MCG/ACT SUSP Place 1 spray into the nose daily. 23 g 5   budesonide -formoterol  (SYMBICORT ) 160-4.5 MCG/ACT inhaler Inhale 2 puffs into the lungs 2 (two) times daily. 1 each 5   carbamazepine (TEGRETOL) 200 MG tablet Take 200 mg by mouth 2 (two) times daily. (Patient not taking: Reported on 12/03/2023)     cromolyn (OPTICROM) 4 % ophthalmic solution Place 1 drop into both eyes as needed.     ibuprofen  (ADVIL ) 800 MG tablet Take by mouth.     levocetirizine (XYZAL ) 5 MG tablet Take 1 tablet (5 mg total) by mouth daily. 30 tablet 5   medroxyPROGESTERone  (DEPO-PROVERA ) 150 MG/ML injection Inject 1 mL (150 mg total) into the muscle every 3 (three) months. 1 mL 3   nicotine  polacrilex (NICORETTE ) 2 MG gum Take 1 each (2 mg total) by mouth as needed for smoking cessation. Maximum:24 pieces/day (Patient not taking: Reported on 12/03/2023) 100 tablet 0   ondansetron  (ZOFRAN -ODT) 4 MG disintegrating tablet Take 4 mg by mouth every 6 (six) hours. (Patient not taking: Reported on 12/03/2023)     polyethylene glycol powder (GLYCOLAX /MIRALAX ) 17 GM/SCOOP powder Take 17 g by mouth 2 (two) times daily as needed. 3350 g 1   QUEtiapine  (SEROQUEL ) 50 MG tablet Take 50 mg by mouth at bedtime. (Patient not taking:  Reported on 12/03/2023)     tacrolimus  (PROTOPIC ) 0.1 % ointment Apply topically 2 (two) times daily as needed. 100 g 0   vitamin C  (ASCORBIC ACID) 250 MG tablet Take 1 tablet (250 mg total) by mouth daily. 30 tablet 0   No current facility-administered medications for this visit.     Known medication allergies: Allergies  Allergen Reactions   Banana Itching   Trazodone  And Nefazodone Itching   Diagnostics/Labs:  Allergy  testing:   Airborne Adult Perc - 01/06/24 0900     Time Antigen Placed 7829    Allergen Manufacturer Floyd Hutchinson    Location Back    Number of Test 55    Panel 1 Select    1. Control-Buffer 50% Glycerol Negative    2. Control-Histamine 2+    3. Bahia 2+    4. [place] 2+    5. Johnson 2+    6. [place]  Blue Negative    7. Meadow Fescue 2+    8. Perennial Rye 2+    9. Timothy Negative    10. Ragweed Mix Negative    11. Cocklebur 2+    12. Plantain,  English 2+    13. Baccharis 2+    14. Dog Fennel Negative    15. [place] Thistle 2+    16. Lamb's Quarters Negative    17. Sheep Sorrell 2+    18. Rough Pigweed Negative    19. Liane Redman Elder, Rough 2+  20. Mugwort, Common Negative    21. Box, Elder Negative    22. Cedar, red Negative    23. Sweet Gum Negative    24. Pecan Pollen Negative    25. Pine Mix Negative    26. Walnut, Black Pollen Negative    27. Red Mulberry 2+    28. Ash Mix Negative    29. Birch Mix Negative    30. Beech American Negative    31. Cottonwood, [place] Negative    32. Hickory, White Negative    33. Maple Mix Negative    34. Oak, [place] Mix Negative    35. Sycamore Eastern Negative    36. Alternaria Alternata Negative    37. Cladosporium Herbarum Negative    38. Aspergillus Mix 2+    39. Penicillium Mix Negative    40. Bipolaris Sorokiniana (Helminthosporium) Negative    41. Drechslera Spicifera (Curvularia) Negative    42. Mucor Plumbeus Negative    43. Fusarium Moniliforme Negative    44. Aureobasidium Pullulans  (pullulara) 2+    45. Rhizopus Oryzae Negative    46. Botrytis Cinera Negative    47. Epicoccum Nigrum Negative    48. Phoma Betae Negative    49. Dust Mite Mix Negative    50. Cat Hair 10,000 BAU/ml Negative    51.  Dog Epithelia Negative    52. Mixed Feathers Negative    53. Horse Epithelia Negative    54. Cockroach, German Negative    55. Tobacco Leaf Negative             Intradermal - 01/06/24 1000     Time Antigen Placed 1000    Allergen Manufacturer Greer    Location Arm    Number of Test 7    Control Negative    Mold 1 Negative    Mold 2 Negative    Mite Mix Negative    Cat Negative    Dog Negative    Cockroach Negative             Food Adult Perc - 01/06/24 0900     Time Antigen Placed 2956    Allergen Manufacturer Floyd Hutchinson    Location Back    Number of allergen test 1    57. Banana 2+             Allergy  testing results were read and interpreted by provider, documented by clinical staff.   Assessment and plan: Asthma - Daily controller medication(s): Symbicort  160/4.27mcg two puffs 2 times daily with spacer device - Prior to physical activity: albuterol  2 puffs 10-15 minutes before physical activity. - Rescue medications: albuterol  2 puffs every 4-6 hours as needed  - Asthma control goals:  * Full participation in all desired activities (may need albuterol  before activity) * Albuterol  use two time or less a week on average (not counting use with activity) * Cough interfering with sleep two time or less a month * Oral steroids no more than once a year * No hospitalizations  Allergic rhinitis with conjunctivitis - Testing today showed: grasses, weeds, trees, indoor molds, and outdoor molds - Copy of test results provided.  - Avoidance measures provided. - Use Xyzal  5mg  daily.  This is a antihistamine.  You can use an extra dose of the antihistamine, if needed, for breakthrough symptoms.  - Continue Cromolyn eye drop 1 drop each eye up to 4  times a day as needed for itchy/watery eyes - Use Dymista  nasal spray 1 spray each nostril  twice a day for runny or stuffy nose.  This is a combination spray with both Flonase  and Astelin  in the same bottle.   With using nasal sprays point tip of bottle toward eye on same side nostril and lean head slightly forward for best technique.   - Use saline rinse kit prior to nasal spray use.  Use distilled water  or boil water  and bring to room temperature (do not use tap water ).  - Consider allergy  shots as a means of long-term control. - Allergy  shots "re-train" and "reset" the immune system to ignore environmental allergens and decrease the resulting immune response to those allergens (sneezing, itchy watery eyes, runny nose, nasal congestion, etc).    - Allergy  shots improve symptoms in 75-85% of patients.  - We can discuss more at a future appointment if the medications are not working for you.  Eczema Eczema on face and back, no previous prescription treatments. - Use Protopic  ointment twice a day as needed for rash on face or another areas of body.  This is a non-steroid ointment safe to use anywhere on body.   Banana Allergy  Allergy  to bananas causing oral burning sensation. - Testing is positive to banana - Continue avoidance  Snoring Suspected sleep apnea due to tonsillar hypertrophy and symptoms of apnea. Discussed tonsillectomy benefits and challenges. Sleep study needed. - Order sleep study to evaluate for sleep apnea.   Routine follow-up visit in 3-4 months  I appreciate the opportunity to take part in Kalysta's care. Please do not hesitate to contact me with questions.  Sincerely,   Catha Clink, MD Allergy /Immunology [facility]

## 2024-02-03 ENCOUNTER — Ambulatory Visit: Payer: MEDICAID | Admitting: Family Medicine

## 2024-02-03 NOTE — Progress Notes (Addendum)
 [address] [place] [address] Dept: [phone]  FOLLOW UP NOTE  Patient ID: Pamela Norris, female    DOB: March 12, 2001  Age: 23 y.o. MRN: [id number] Date of Office Visit: 02/04/2024  Assessment  Chief Complaint: Seasonal and perennial allergic rhinitis, Asthma, Snoring, Banana allergy , Eczema, Follow-up, Cough, and Wheezing  HPI Pamela Norris is a 23 year old female who presents to the clinic for a follow-up visit.  She was last seen in this clinic on 01/06/2024 by Dr. 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  In the interim, she reports that she has had antibiotics 2 times for sinus issues since her last visit to this clinic.  At today's visit, she reports her asthma has not been well-controlled with symptoms including shortness of breath, occasional wheeze, and cough producing mucus occurring in the daytime and nighttime.  She continues Symbicort  160-2 puffs twice a day and is currently out of albuterol .    Allergic rhinitis is reported as poorly controlled with symptoms including clear rhinorrhea, nasal congestion, sneezing, and postnasal drainage.  She continues Xyzol 5 mg once a day and is not currently using saline nasal rinses or Dymista .  She does report that Dymista  made her feel nauseated.  She reports that she spends a lot of time outside which aggravates her asthma.  Her last environmental allergy  skin testing was on 01/06/2024 and was positive to grass pollen, weed pollen, tree pollen, indoor mold, and outdoor mold.  She is interested in beginning allergen immunotherapy when her breathing returns to baseline.  Atopic dermatitis is reported as moderately well-controlled with occasional red and itchy areas mainly occurring on her back.  She is not currently using a moisturizing routine.  She reports that her insurance would not cover Protopic .  She  continues to avoid banana with no accidental exposure or EpiPen use since her last visit to this clinic.  Her last skin testing to banana on 01/06/2024 was positive.  She reports that she occasionally experiences heartburn for which she is not currently taking any medications.  She continues to snore and has not followed through with the sleep study at this time.  Her current medications are listed in the chart.  Drug Allergies:  Allergies  Allergen Reactions   Banana Itching   Trazodone  And Nefazodone Itching    Physical Exam: BP 104/66 (BP Location: Left Arm, Patient Position: Sitting, Cuff Size: Normal)   Pulse 94   Temp 98.2 F (36.8 C) (Temporal)   Wt 140 lb 6.4 oz (63.7 kg)   SpO2 97%   BMI 21.35 kg/m    Physical Exam Vitals reviewed.  Constitutional:      Appearance: Normal appearance.  HENT:     Head: Normocephalic and atraumatic.     Right Ear: Tympanic membrane normal.     Left Ear: Tympanic membrane normal.     Nose:     Comments: Bilateral nares slightly erythematous with thin clear nasal drainage noted.  Pharynx normal.  Ears normal.  Eyes normal.    Mouth/Throat:     Pharynx: Oropharynx is clear.  Eyes:     Conjunctiva/sclera: Conjunctivae normal.  Cardiovascular:     Rate and Rhythm: Normal rate and regular rhythm.     Heart sounds: Normal heart sounds. No murmur heard. Pulmonary:     Effort: Pulmonary effort is normal.     Breath sounds: Normal breath sounds.  Comments: Lungs clear to auscultation Musculoskeletal:        General: Normal range of motion.     Cervical back: Normal range of motion and neck supple.  Skin:    General: Skin is warm and dry.  Neurological:     Mental Status: She is alert and oriented to person, place, and time.  Psychiatric:        Mood and Affect: Mood normal.        Behavior: Behavior normal.        Thought Content: Thought content normal.        Judgment: Judgment normal.     Diagnostics: FVC 2.41 which is  55% of predicted value, FEV1 2.26 which is 59% of predicted value.  Spirometry indicates possible restriction.  This is consistent with previous spirometry reading  Assessment and Plan: 1. Poorly controlled intermittent asthma without complication   2. Seasonal and perennial allergic rhinitis   3. Allergic conjunctivitis of both eyes   4. Adverse food reaction, subsequent encounter   5. Snoring   6. Dermatitis     Meds ordered this encounter  Medications   albuterol  (VENTOLIN  HFA) 108 (90 Base) MCG/ACT inhaler    Sig: Inhale 1 puff into the lungs every 6 (six) hours as needed for wheezing or shortness of breath.    Dispense:  8 g    Refill:  1   budesonide -formoterol  (SYMBICORT ) 160-4.5 MCG/ACT inhaler    Sig: Inhale 2 puffs into the lungs 2 (two) times daily.    Dispense:  1 each    Refill:  5   cromolyn (OPTICROM) 4 % ophthalmic solution    Sig: Place 2 drops into both eyes 4 (four) times daily.    Dispense:  1 mL    Refill:  5   fluticasone  (FLONASE ) 50 MCG/ACT nasal spray    Sig: Place 2 sprays into both nostrils daily.    Dispense:  16 g    Refill:  5   EPINEPHrine (EPIPEN 2-PAK) 0.3 mg/0.3 mL IJ SOAJ injection    Sig: Inject 0.3 mg into the muscle once for 1 dose.    Dispense:  1 each    Refill:  2   desonide (DESOWEN) 0.05 % ointment    Sig: Apply 1 Application topically 2 (two) times daily.    Dispense:  15 g    Refill:  0    Patient Instructions  Asthma Begin Spiriva 1.25 mcg - 2 puffs once a day to prevent cough or wheeze Continue Symbicort  160-2 puffs twice a day with a spacer to prevent cough or wheeze Continue albuterol  2 puffs once every 4 hours if needed for cough or wheeze You may use albuterol  2 puffs 5 to 15 minutes before activity to decrease cough or wheeze  Allergic rhinitis Continue allergen avoidance measures directed toward grass pollen, weed pollen, tree pollen, indoor mold, and outdoor molds Continue Xyzal  5 mg once a day if needed for a runny  nose or itch Begin Flonase  2 sprays in each nostril once a day if needed for nasal symptoms Consider saline nasal rinses as needed for nasal symptoms. Use this before any medicated nasal sprays for best result Consider allergen immunotherapy if your symptoms are not well-controlled with the treatment plan as listed above.  Written information provided at today's visit.  Make an appointment at your next visit to begin allergen immunotherapy. Your breathing must be well controlled before beginning allergy  injections  Allergic conjunctivitis Begin cromolyn 1 to  2 drops in each eye up to 4 times a day if needed for red or itchy eyes  Atopic dermatitis Continue a twice a day moisturizing routine Begin desonide 0.05% ointment to reddened itchy areas up to twice a day if needed.  Do not use this medication longer than 2 weeks in a row  Food allergy  Continue to avoid banana.  In case of an allergic reaction, take Benadryl  50 mg every 4 hours, and if life-threatening symptoms occur, inject with EpiPen 0.3 mg.  Snoring Continue with your sleep study that was ordered at a previous visit  Call the clinic if this treatment plan is not working well for you.  Follow up in 1 month or sooner if needed.   Return in about 4 weeks (around 03/03/2024), or if symptoms worsen or fail to improve.    Thank you for the opportunity to care for this patient.  Please do not hesitate to contact me with questions.  Marinus Sic, FNP [facility] 

## 2024-02-03 NOTE — Patient Instructions (Signed)
 Asthma Begin Spiriva 1.25 mcg - 2 puffs once a day to prevent cough or wheeze Continue Symbicort  160-2 puffs twice a day with a spacer to prevent cough or wheeze Continue albuterol  2 puffs once every 4 hours if needed for cough or wheeze You may use albuterol  2 puffs 5 to 15 minutes before activity to decrease cough or wheeze  Allergic rhinitis Continue allergen avoidance measures directed toward grass pollen, weed pollen, tree pollen, indoor mold, and outdoor molds Continue Xyzal  5 mg once a day if needed for a runny nose or itch Begin Flonase  2 sprays in each nostril once a day if needed for nasal symptoms Consider saline nasal rinses as needed for nasal symptoms. Use this before any medicated nasal sprays for best result Consider allergen immunotherapy if your symptoms are not well-controlled with the treatment plan as listed above.  Written information provided at today's visit.  Make an appointment at your next visit to begin allergen immunotherapy. Your breathing must be well controlled before beginning allergy  injections  Allergic conjunctivitis Begin cromolyn 1 to 2 drops in each eye up to 4 times a day if needed for red or itchy eyes  Atopic dermatitis Continue a twice a day moisturizing routine Begin desonide 0.05% ointment to reddened itchy areas up to twice a day if needed.  Do not use this medication longer than 2 weeks in a row  Food allergy  Continue to avoid banana.  In case of an allergic reaction, take Benadryl  50 mg every 4 hours, and if life-threatening symptoms occur, inject with EpiPen 0.3 mg.  Snoring Continue with your sleep study that was ordered at a previous visit  Call the clinic if this treatment plan is not working well for you.  Follow up in 1 month or sooner if needed.  Reducing Pollen Exposure The American Academy of Allergy , Asthma and Immunology suggests the following steps to reduce your exposure to pollen during allergy  seasons. Do not hang sheets  or clothing out to dry; pollen may collect on these items. Do not mow lawns or spend time around freshly cut grass; mowing stirs up pollen. Keep windows closed at night.  Keep car windows closed while driving. Minimize morning activities outdoors, a time when pollen counts are usually at their highest. Stay indoors as much as possible when pollen counts or humidity is high and on windy days when pollen tends to remain in the air longer. Use air conditioning when possible.  Many air conditioners have filters that trap the pollen spores. Use a HEPA room air filter to remove pollen form the indoor air you breathe.  Control of Mold Allergen Mold and fungi can grow on a variety of surfaces provided certain temperature and moisture conditions exist.  Outdoor molds grow on plants, decaying vegetation and soil.  The major outdoor mold, Alternaria and Cladosporium, are found in very high numbers during hot and dry conditions.  Generally, a late Summer - Fall peak is seen for common outdoor fungal spores.  Rain will temporarily lower outdoor mold spore count, but counts rise rapidly when the rainy period ends.  The most important indoor molds are Aspergillus and Penicillium.  Dark, humid and poorly ventilated basements are ideal sites for mold growth.  The next most common sites of mold growth are the bathroom and the kitchen.  Outdoor [employer] Use air conditioning and keep windows closed Avoid exposure to decaying vegetation. Avoid leaf raking. Avoid grain handling. Consider wearing a face mask if working in [employer]  areas.  Indoor Mold Control Maintain humidity below 50%. Clean washable surfaces with 5% bleach solution. Remove sources e.g. Contaminated carpets.

## 2024-02-04 ENCOUNTER — Ambulatory Visit (INDEPENDENT_AMBULATORY_CARE_PROVIDER_SITE_OTHER): Payer: MEDICAID | Admitting: Family Medicine

## 2024-02-04 ENCOUNTER — Encounter: Payer: Self-pay | Admitting: Family Medicine

## 2024-02-04 ENCOUNTER — Other Ambulatory Visit: Payer: Self-pay

## 2024-02-04 VITALS — BP 104/66 | HR 94 | Temp 98.2°F | Wt 140.4 lb

## 2024-02-04 DIAGNOSIS — L309 Dermatitis, unspecified: Secondary | ICD-10-CM | POA: Insufficient documentation

## 2024-02-04 DIAGNOSIS — J302 Other seasonal allergic rhinitis: Secondary | ICD-10-CM | POA: Insufficient documentation

## 2024-02-04 DIAGNOSIS — J4520 Mild intermittent asthma, uncomplicated: Secondary | ICD-10-CM | POA: Insufficient documentation

## 2024-02-04 DIAGNOSIS — H1013 Acute atopic conjunctivitis, bilateral: Secondary | ICD-10-CM | POA: Insufficient documentation

## 2024-02-04 DIAGNOSIS — J3089 Other allergic rhinitis: Secondary | ICD-10-CM

## 2024-02-04 DIAGNOSIS — R0683 Snoring: Secondary | ICD-10-CM | POA: Insufficient documentation

## 2024-02-04 DIAGNOSIS — T781XXD Other adverse food reactions, not elsewhere classified, subsequent encounter: Secondary | ICD-10-CM

## 2024-02-04 DIAGNOSIS — T781XXA Other adverse food reactions, not elsewhere classified, initial encounter: Secondary | ICD-10-CM | POA: Insufficient documentation

## 2024-02-04 MED ORDER — FLUTICASONE PROPIONATE 50 MCG/ACT NA SUSP: 2.0000 | Freq: Every day | NASAL | 5 refills | Status: DC

## 2024-02-04 MED ORDER — DESONIDE 0.05 % EX OINT: 1.0000 | TOPICAL_OINTMENT | Freq: Two times a day (BID) | CUTANEOUS | 0 refills | Status: DC

## 2024-02-04 MED ORDER — EPINEPHRINE 0.3 MG/0.3ML IJ SOAJ: 0.3000 mg | Freq: Once | INTRAMUSCULAR | 2 refills | Status: AC

## 2024-02-04 MED ORDER — ALBUTEROL SULFATE HFA 108 (90 BASE) MCG/ACT IN AERS: 1.0000 | INHALATION_SPRAY | Freq: Four times a day (QID) | RESPIRATORY_TRACT | 1 refills | Status: AC | PRN

## 2024-02-04 MED ORDER — CROMOLYN SODIUM 4 % OP SOLN: 2.0000 [drp] | Freq: Four times a day (QID) | OPHTHALMIC | 5 refills | Status: DC

## 2024-02-04 MED ORDER — BUDESONIDE-FORMOTEROL FUMARATE 160-4.5 MCG/ACT IN AERO: 2.0000 | INHALATION_SPRAY | Freq: Two times a day (BID) | RESPIRATORY_TRACT | 5 refills | Status: AC

## 2024-02-09 ENCOUNTER — Ambulatory Visit: Payer: MEDICAID | Admitting: Family Medicine

## 2024-02-10 ENCOUNTER — Encounter: Payer: Self-pay | Admitting: Family Medicine

## 2024-02-11 ENCOUNTER — Ambulatory Visit: Payer: MEDICAID

## 2024-02-11 NOTE — Telephone Encounter (Signed)
 The spirometry asthma testing looked about the same as last time. This is only one piece of the puzzle though. We also consider your asthma symptoms like shortness of breath, cough and wheeze. Thank you

## 2024-02-12 ENCOUNTER — Encounter: Payer: Self-pay | Admitting: Family Medicine

## 2024-02-12 ENCOUNTER — Telehealth: Payer: Self-pay | Admitting: Family Medicine

## 2024-02-12 NOTE — Telephone Encounter (Signed)
 Copied from CRM [id number]. Topic: Appointments - Transfer of Care >> Feb 12, 2024 11:22 AM [employer] wrote: Pt is requesting to transfer FROM: Jacqualyn Mates FNP Pt is requesting to transfer TO: Lugenia Said, FNP Reason for requested transfer: Jacqualyn Mates, FNP leaving soon It is the responsibility of the team the patient would like to transfer to Britta Candy, [place], FNP) to reach out to the patient if for any reason this transfer is not acceptable.

## 2024-02-13 ENCOUNTER — Telehealth: Payer: MEDICAID | Admitting: Nurse Practitioner

## 2024-02-13 DIAGNOSIS — K5900 Constipation, unspecified: Secondary | ICD-10-CM

## 2024-02-13 MED ORDER — SENNA-DOCUSATE SODIUM 8.6-50 MG PO TABS: 2.0000 | ORAL_TABLET | Freq: Every day | ORAL | 0 refills | Status: AC

## 2024-02-13 MED ORDER — POLYETHYLENE GLYCOL 3350 17 GM/SCOOP PO POWD
17.0000 g | Freq: Every day | ORAL | 1 refills | Status: AC
Start: 2024-02-13 — End: ?

## 2024-02-13 NOTE — Patient Instructions (Signed)
 Pamela Norris, thank you for joining [name], NP for today's virtual visit.  While this provider is not your primary care provider (PCP), if your PCP is located in our provider database this encounter information will be shared with them immediately following your visit.   A [facility] MyChart account gives you access to today's visit and all your visits, tests, and labs performed at [facility] " click here if you don't have a [facility] MyChart account or go to [email or web address]  Consent: (Patient) Pamela Norris provided verbal consent for this virtual visit at the beginning of the encounter.  Current Medications:  Current Outpatient Medications:    sennosides-docusate sodium (SENOKOT-S) 8.6-50 MG tablet, Take 2 tablets by mouth daily for 7 days., Disp: 14 tablet, Rfl: 0   albuterol  (VENTOLIN  HFA) 108 (90 Base) MCG/ACT inhaler, Inhale 1 puff into the lungs every 6 (six) hours as needed for wheezing or shortness of breath., Disp: 8 g, Rfl: 1   atomoxetine  (STRATTERA ) 60 MG capsule, Take 40 mg by mouth daily., Disp: , Rfl:    Azelastine -Fluticasone  137-50 MCG/ACT SUSP, Place 1 spray into the nose daily., Disp: 23 g, Rfl: 5   budesonide -formoterol  (SYMBICORT ) 160-4.5 MCG/ACT inhaler, Inhale 2 puffs into the lungs 2 (two) times daily., Disp: 1 each, Rfl: 5   carbamazepine (TEGRETOL) 200 MG tablet, Take 200 mg by mouth 2 (two) times daily. (Patient not taking: Reported on 11/20/2023), Disp: , Rfl:    cromolyn  (OPTICROM ) 4 % ophthalmic solution, Place 2 drops into both eyes 4 (four) times daily., Disp: 1 mL, Rfl: 5   desonide  (DESOWEN ) 0.05 % ointment, Apply 1 Application topically 2 (two) times daily., Disp: 15 g, Rfl: 0   fluticasone  (FLONASE ) 50 MCG/ACT nasal spray, Place 2 sprays into both nostrils daily., Disp: 16 g, Rfl: 5   ibuprofen  (ADVIL ) 800 MG tablet, Take by mouth., Disp: , Rfl:    levocetirizine (XYZAL ) 5 MG tablet, Take 1 tablet (5 mg total) by mouth  daily., Disp: 30 tablet, Rfl: 5   LYBALVI 5-10 MG TABS, Take 1 tablet by mouth at bedtime. (Patient not taking: Reported on 02/04/2024), Disp: , Rfl:    medroxyPROGESTERone  (DEPO-PROVERA ) 150 MG/ML injection, Inject 1 mL (150 mg total) into the muscle every 3 (three) months., Disp: 1 mL, Rfl: 3   nicotine  polacrilex (NICORETTE ) 2 MG gum, Take 1 each (2 mg total) by mouth as needed for smoking cessation. Maximum:24 pieces/day (Patient not taking: Reported on 12/03/2023), Disp: 100 tablet, Rfl: 0   ondansetron  (ZOFRAN -ODT) 4 MG disintegrating tablet, Take 4 mg by mouth every 6 (six) hours. (Patient not taking: Reported on 02/04/2024), Disp: , Rfl:    polyethylene glycol powder (GLYCOLAX /MIRALAX ) 17 GM/SCOOP powder, Take 17 g by mouth daily., Disp: 3350 g, Rfl: 1   QUEtiapine  (SEROQUEL ) 50 MG tablet, Take 50 mg by mouth at bedtime. (Patient not taking: Reported on 11/20/2023), Disp: , Rfl:    tacrolimus  (PROTOPIC ) 0.1 % ointment, Apply topically 2 (two) times daily as needed. (Patient not taking: Reported on 02/04/2024), Disp: 100 g, Rfl: 0   topiramate (TOPAMAX) 25 MG tablet, Take 25 mg by mouth daily., Disp: , Rfl:    vitamin C  (ASCORBIC ACID) 250 MG tablet, Take 1 tablet (250 mg total) by mouth daily. (Patient not taking: Reported on 02/04/2024), Disp: 30 tablet, Rfl: 0   Medications ordered in this encounter:  Meds ordered this encounter  Medications   sennosides-docusate sodium (SENOKOT-S) 8.6-50 MG tablet  Sig: Take 2 tablets by mouth daily for 7 days.    Dispense:  14 tablet    Refill:  0    Supervising Provider:   LAMPTEY, PHILIP O [[id number]]   polyethylene glycol powder (GLYCOLAX /MIRALAX ) 17 GM/SCOOP powder    Sig: Take 17 g by mouth daily.    Dispense:  3350 g    Refill:  1    Supervising Provider:   Corine Dice [[id number]]     *If you need refills on other medications prior to your next appointment, please contact your pharmacy*  Follow-Up: Call back or seek an in-person evaluation  if the symptoms worsen or if the condition fails to improve as anticipated.  [facility] [phone]  Other Instructions Increase water  intake to 64-80oz daily Increase fruits and vegetables.    If you have been instructed to have an in-person evaluation today at a local Urgent Care facility, please use the link below. It will take you to a list of all of our available [facility], including address, phone number and hours of operation. Please do not delay care.  [facility]  If you or a family member do not have a primary care provider, use the link below to schedule a visit and establish care. When you choose a [facility] primary care physician or advanced practice provider, you gain a long-term partner in health. Find a Primary Care Provider  Learn more about [facility]'s in-office and virtual care options: [facility] Get Care Now

## 2024-02-13 NOTE — Progress Notes (Signed)
 Virtual Visit Consent   Pamela Norris, you are scheduled for a virtual visit with a [facility] provider today. Just as with appointments in the office, your consent must be obtained to participate. Your consent will be active for this visit and any virtual visit you may have with one of our providers in the next 365 days. If you have a MyChart account, a copy of this consent can be sent to you electronically.  As this is a virtual visit, video technology does not allow for your provider to perform a traditional examination. This may limit your provider's ability to fully assess your condition. If your provider identifies any concerns that need to be evaluated in person or the need to arrange testing (such as labs, EKG, etc.), we will make arrangements to do so. Although advances in technology are sophisticated, we cannot ensure that it will always work on either your end or our end. If the connection with a video visit is poor, the visit may have to be switched to a telephone visit. With either a video or telephone visit, we are not always able to ensure that we have a secure connection.  By engaging in this virtual visit, you consent to the provision of healthcare and authorize for your insurance to be billed (if applicable) for the services provided during this visit. Depending on your insurance coverage, you may receive a charge related to this service.  I need to obtain your verbal consent now. Are you willing to proceed with your visit today? Pamela Norris has provided verbal consent on 02/13/2024 for a virtual visit (video or telephone). [name], NP  Date: 02/13/2024 3:34 PM   Virtual Visit via Video Note   I, [name], connected with  MARIJOSE Norris  ([id number], 2001-09-04) on 02/13/24 at  3:30 PM EDT by a video-enabled telemedicine application and verified that I am speaking with the correct person using two identifiers.  Location: Patient: Virtual Visit Location  Patient: Home Provider: Virtual Visit Location Provider: Home Office   I discussed the limitations of evaluation and management by telemedicine and the availability of in person appointments. The patient expressed understanding and agreed to proceed.    History of Present Illness: Pamela Norris is a 23 y.o. who identifies as a female who was assigned female at birth, and is being seen today for constipation.   Teghan has a history of constipation. She ran out of miralax  and has been experiencing stomach cramping and no bowel movement for the past few days. 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   MDD (major depressive disorder), recurrent severe, without psychosis (HCC) 10/06/2021   Adjustment disorder with mixed anxiety and depressed mood 10/06/2021   DMDD (disruptive mood dysregulation disorder) (HCC) 11/18/2020   Bilateral temporomandibular joint pain 09/15/2020   Viral pharyngitis 07/12/2020   Conduct disorder 07/13/2019   Oppositional defiant disorder    Disruptive mood dysregulation disorder (HCC) 11/08/2015   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   Attention deficit hyperactivity disorder (ADHD), combined type  03/21/2013    Allergies:  Allergies  Allergen Reactions   Banana Itching   Trazodone  And Nefazodone Itching   Medications:  Current Outpatient Medications:    sennosides-docusate sodium (SENOKOT-S) 8.6-50 MG tablet, Take 2 tablets by mouth daily for 7 days., Disp: 14 tablet, Rfl: 0   albuterol   (VENTOLIN  HFA) 108 (90 Base) MCG/ACT inhaler, Inhale 1 puff into the lungs every 6 (six) hours as needed for wheezing or shortness of breath., Disp: 8 g, Rfl: 1   atomoxetine  (STRATTERA ) 60 MG capsule, Take 40 mg by mouth daily., Disp: , Rfl:    Azelastine -Fluticasone  137-50 MCG/ACT SUSP, Place 1 spray into the nose daily., Disp: 23 g, Rfl: 5   budesonide -formoterol  (SYMBICORT ) 160-4.5 MCG/ACT inhaler, Inhale 2 puffs into the lungs 2 (two) times daily., Disp: 1 each, Rfl: 5   carbamazepine (TEGRETOL) 200 MG tablet, Take 200 mg by mouth 2 (two) times daily. (Patient not taking: Reported on 11/20/2023), Disp: , Rfl:    cromolyn  (OPTICROM ) 4 % ophthalmic solution, Place 2 drops into both eyes 4 (four) times daily., Disp: 1 mL, Rfl: 5   desonide  (DESOWEN ) 0.05 % ointment, Apply 1 Application topically 2 (two) times daily., Disp: 15 g, Rfl: 0   fluticasone  (FLONASE ) 50 MCG/ACT nasal spray, Place 2 sprays into both nostrils daily., Disp: 16 g, Rfl: 5   ibuprofen  (ADVIL ) 800 MG tablet, Take by mouth., Disp: , Rfl:    levocetirizine (XYZAL ) 5 MG tablet, Take 1 tablet (5 mg total) by mouth daily., Disp: 30 tablet, Rfl: 5   LYBALVI 5-10 MG TABS, Take 1 tablet by mouth at bedtime. (Patient not taking: Reported on 02/04/2024), Disp: , Rfl:    medroxyPROGESTERone  (DEPO-PROVERA ) 150 MG/ML injection, Inject 1 mL (150 mg total) into the muscle every 3 (three) months., Disp: 1 mL, Rfl: 3   nicotine  polacrilex (NICORETTE ) 2 MG gum, Take 1 each (2 mg total) by mouth as needed for smoking cessation. Maximum:24 pieces/day (Patient not taking: Reported on 12/03/2023), Disp: 100 tablet, Rfl: 0   ondansetron  (ZOFRAN -ODT) 4 MG disintegrating tablet, Take 4 mg by mouth every 6 (six) hours. (Patient not taking: Reported on 02/04/2024), Disp: , Rfl:    polyethylene glycol powder (GLYCOLAX /MIRALAX ) 17 GM/SCOOP powder, Take 17 g by mouth daily., Disp: 3350 g, Rfl: 1   QUEtiapine  (SEROQUEL ) 50 MG tablet, Take 50 mg by mouth at bedtime.  (Patient not taking: Reported on 11/20/2023), Disp: , Rfl:    tacrolimus  (PROTOPIC ) 0.1 % ointment, Apply topically 2 (two) times daily as needed. (Patient not taking: Reported on 02/04/2024), Disp: 100 g, Rfl: 0   topiramate (TOPAMAX) 25 MG tablet, Take 25 mg by mouth daily., Disp: , Rfl:    vitamin C  (ASCORBIC ACID) 250 MG tablet, Take 1 tablet (250 mg total) by mouth daily. (Patient not taking: Reported on 02/04/2024), Disp: 30 tablet, Rfl: 0  Observations/Objective: Patient is well-developed, well-nourished in no acute distress.  Resting comfortably at home.  Head is normocephalic, atraumatic.  No labored breathing.  Speech is clear and coherent with logical content.  Patient is alert and oriented at baseline.    Assessment and Plan: 1. Constipation, unspecified constipation type - sennosides-docusate sodium (SENOKOT-S) 8.6-50 MG tablet; Take 2 tablets by mouth daily for 7 days.  Dispense: 14 tablet; Refill: 0 - polyethylene glycol powder (GLYCOLAX /MIRALAX ) 17 GM/SCOOP powder; Take 17 g by mouth daily.  Dispense: 3350 g; Refill: 1 Increase water  intake to 64-80oz daily Increase fruits and vegetables.   Follow Up Instructions:  I discussed the assessment and treatment plan with the patient. The patient was provided an opportunity to ask questions and all were answered. The patient agreed with the plan and demonstrated an understanding of the instructions.  A copy of instructions were sent to the patient via MyChart unless otherwise noted below.    The patient was advised to call back or seek an in-person evaluation if the symptoms worsen or if the condition fails to improve as anticipated.    Bedelia Pong W Kaytlan Behrman, NP

## 2024-02-15 ENCOUNTER — Other Ambulatory Visit: Payer: Self-pay | Admitting: Allergy

## 2024-02-15 DIAGNOSIS — J302 Other seasonal allergic rhinitis: Secondary | ICD-10-CM

## 2024-02-15 DIAGNOSIS — J301 Allergic rhinitis due to pollen: Secondary | ICD-10-CM | POA: Diagnosis not present

## 2024-02-15 DIAGNOSIS — H1013 Acute atopic conjunctivitis, bilateral: Secondary | ICD-10-CM

## 2024-02-15 NOTE — Progress Notes (Signed)
 Aeroallergen Immunotherapy   Ordering Provider: Dr. Catha Clink   Patient Details  Name: Pamela Norris  MRN: [id number]  Date of Birth: 2000/10/26   Order 1 of 2   Vial Label: Pollen   0.3 ml (Volume)  BAU Concentration -- 7 Grass Mix* 100,000 ([place]  Blue, [place], Montrose, Perennial Rye, RedTop, Sweet Vernal, Timothy)  0.2 ml (Volume)  1:20 Concentration -- [place]  0.3 ml (Volume)  BAU Concentration -- [place] 10,000  0.2 ml (Volume)  1:20 Concentration -- [place]  0.2 ml (Volume)  1:20 Concentration -- Cocklebur  0.2 ml (Volume)  1:10 Concentration -- Plantain English  0.2 ml (Volume)  1:20 Concentration -- Marsh elder, Rough*  0.2 ml (Volume)  1:20 Concentration -- [place] Thistle  0.2 ml (Volume)  1:40 Concentration -- Baccharis  0.2 ml (Volume)  1:20 Concentration -- Red Mulberry    2.2  ml Extract Subtotal  2.8  ml Diluent   5.0  ml Maintenance Total   Schedule:  B  Blue Vial (1:100,000): Schedule B (6 doses)  Yellow Vial (1:10,000): Schedule B (6 doses)  Green Vial (1:1,000): Schedule B (6 doses)  Red Vial (1:100): Schedule A (14 doses)   Special Instructions:  After completion of the first Red Vial, please space to every two weeks. After completion of the second Red Vial, please space to every 4 weeks. Ok to up dose new vials at 0.78mL --> 0.3 mL --> 0.5 mL.  Ok to come twice weekly, if desired, as long as there is 48 hours between injections.

## 2024-02-15 NOTE — Progress Notes (Signed)
 Aeroallergen Immunotherapy   Ordering Provider: Dr. Catha Clink   Patient Details  Name: Pamela Norris  MRN: [id number]  Date of Birth: 2000/11/05   Order 2 of 2   Vial Label: mold   0.2 ml (Volume)  1:10 Concentration -- Aspergillus mix  0.2 ml (Volume)  1:40 Concentration -- Aureobasidium pullulans    0.4  ml Extract Subtotal  4.6  ml Diluent   5.0  ml Maintenance Total   Schedule:  B  Blue Vial (1:100,000): Schedule B (6 doses)  Yellow Vial (1:10,000): Schedule B (6 doses)  Green Vial (1:1,000): Schedule B (6 doses)  Red Vial (1:100): Schedule A (14 doses)   Special Instructions: After completion of the first Red Vial, please space to every two weeks. After completion of the second Red Vial, please space to every 4 weeks. Ok to up dose new vials at 0.12mL --> 0.3 mL --> 0.5 mL.  Ok to come twice weekly, if desired, as long as there is 48 hours between injections.

## 2024-02-15 NOTE — Progress Notes (Signed)
 VIALS MADE 02-14-25

## 2024-02-16 ENCOUNTER — Ambulatory Visit: Payer: MEDICAID | Admitting: Family Medicine

## 2024-02-16 DIAGNOSIS — J302 Other seasonal allergic rhinitis: Secondary | ICD-10-CM | POA: Diagnosis not present

## 2024-02-17 ENCOUNTER — Ambulatory Visit: Payer: MEDICAID | Admitting: Family Medicine

## 2024-02-25 ENCOUNTER — Ambulatory Visit: Payer: MEDICAID | Admitting: Family Medicine

## 2024-03-08 ENCOUNTER — Ambulatory Visit: Payer: MEDICAID

## 2024-03-08 ENCOUNTER — Encounter: Payer: MEDICAID | Admitting: Family Medicine

## 2024-03-09 ENCOUNTER — Emergency Department (HOSPITAL_COMMUNITY)
Admission: EM | Admit: 2024-03-09 | Discharge: 2024-03-09 | Disposition: A | Discharge status: Home / Self Care | Payer: MEDICAID | Attending: Emergency Medicine | Admitting: Emergency Medicine

## 2024-03-09 ENCOUNTER — Encounter (HOSPITAL_COMMUNITY): Payer: Self-pay

## 2024-03-09 ENCOUNTER — Other Ambulatory Visit: Payer: Self-pay

## 2024-03-09 DIAGNOSIS — R5383 Other fatigue: Secondary | ICD-10-CM | POA: Insufficient documentation

## 2024-03-09 DIAGNOSIS — R531 Weakness: Secondary | ICD-10-CM | POA: Diagnosis not present

## 2024-03-09 DIAGNOSIS — J45909 Unspecified asthma, uncomplicated: Secondary | ICD-10-CM | POA: Insufficient documentation

## 2024-03-09 DIAGNOSIS — R102 Pelvic and perineal pain unspecified side: Secondary | ICD-10-CM | POA: Insufficient documentation

## 2024-03-09 DIAGNOSIS — R109 Unspecified abdominal pain: Secondary | ICD-10-CM | POA: Insufficient documentation

## 2024-03-09 LAB — URINALYSIS, ROUTINE W REFLEX MICROSCOPIC
Bilirubin Urine: NEGATIVE
Glucose, UA: NEGATIVE mg/dL
Hgb urine dipstick: NEGATIVE
Ketones, ur: NEGATIVE mg/dL
Leukocytes,Ua: NEGATIVE
Nitrite: NEGATIVE
Protein, ur: NEGATIVE mg/dL
Specific Gravity, Urine: 1.023 (ref 1.005–1.030)
pH: 6 (ref 5.0–8.0)

## 2024-03-09 LAB — CBC WITH DIFFERENTIAL/PLATELET
Abs Immature Granulocytes: 0.02 10*3/uL (ref 0.00–0.07)
Basophils Absolute: 0 10*3/uL (ref 0.0–0.1)
Basophils Relative: 1 %
Eosinophils Absolute: 0 10*3/uL (ref 0.0–0.5)
Eosinophils Relative: 1 %
HCT: 42.1 % (ref 36.0–46.0)
Hemoglobin: 13.2 g/dL (ref 12.0–15.0)
Immature Granulocytes: 0 %
Lymphocytes Relative: 25 %
Lymphs Abs: 1.7 10*3/uL (ref 0.7–4.0)
MCH: 28.1 pg (ref 26.0–34.0)
MCHC: 31.4 g/dL (ref 30.0–36.0)
MCV: 89.6 fL (ref 80.0–100.0)
Monocytes Absolute: 0.7 10*3/uL (ref 0.1–1.0)
Monocytes Relative: 9 %
Neutro Abs: 4.5 10*3/uL (ref 1.7–7.7)
Neutrophils Relative %: 64 %
Platelets: 351 10*3/uL (ref 150–400)
RBC: 4.7 MIL/uL (ref 3.87–5.11)
RDW: 12.3 % (ref 11.5–15.5)
WBC: 6.9 10*3/uL (ref 4.0–10.5)
nRBC: 0 % (ref 0.0–0.2)

## 2024-03-09 LAB — BASIC METABOLIC PANEL WITH GFR
Anion gap: 8 (ref 5–15)
BUN: 14 mg/dL (ref 6–20)
CO2: 23 mmol/L (ref 22–32)
Calcium: 9.2 mg/dL (ref 8.9–10.3)
Chloride: 107 mmol/L (ref 98–111)
Creatinine, Ser: 0.44 mg/dL (ref 0.44–1.00)
GFR, Estimated: >60 mL/min (ref >60)
Glucose, Bld: 83 mg/dL (ref 70–99)
Potassium: 3.6 mmol/L (ref 3.5–5.1)
Sodium: 138 mmol/L (ref 135–145)

## 2024-03-09 LAB — PREGNANCY, URINE: Preg Test, Ur: NEGATIVE

## 2024-03-09 NOTE — Patient Instructions (Incomplete)
 Asthma Continue Spiriva 1.25 mcg - 2 puffs once a day to prevent cough or wheeze Continue Symbicort  160-2 puffs twice a day with a spacer to prevent cough or wheeze Continue albuterol  2 puffs once every 4 hours if needed for cough or wheeze You may use albuterol  2 puffs 5 to 15 minutes before activity to decrease cough or wheeze  Allergic rhinitis Continue allergen avoidance measures directed toward grass pollen, weed pollen, tree pollen, indoor mold, and outdoor molds Continue Xyzal  5 mg once a day if needed for a runny nose or itch Begin Flonase  2 sprays in each nostril once a day if needed for nasal symptoms Consider saline nasal rinses as needed for nasal symptoms. Use this before any medicated nasal sprays for best result Consider allergen immunotherapy if your symptoms are not well-controlled with the treatment plan as listed above.  Written information provided at today's visit.  Make an appointment at your next visit to begin allergen immunotherapy. Your breathing must be well controlled before beginning allergy  injections  Allergic conjunctivitis Begin cromolyn  1 to 2 drops in each eye up to 4 times a day if needed for red or itchy eyes  Atopic dermatitis Continue a twice a day moisturizing routine Begin desonide  0.05% ointment to reddened itchy areas up to twice a day if needed.  Do not use this medication longer than 2 weeks in a row  Food allergy  Continue to avoid banana.  In case of an allergic reaction, take Benadryl  50 mg every 4 hours, and if life-threatening symptoms occur, inject with EpiPen  0.3 mg.  Snoring Continue with your sleep study that was ordered at a previous visit  Call the clinic if this treatment plan is not working well for you.  Follow up in 1 month or sooner if needed.  Reducing Pollen Exposure The American Academy of Allergy , Asthma and Immunology suggests the following steps to reduce your exposure to pollen during allergy  seasons. Do not hang  sheets or clothing out to dry; pollen may collect on these items. Do not mow lawns or spend time around freshly cut grass; mowing stirs up pollen. Keep windows closed at night.  Keep car windows closed while driving. Minimize morning activities outdoors, a time when pollen counts are usually at their highest. Stay indoors as much as possible when pollen counts or humidity is high and on windy days when pollen tends to remain in the air longer. Use air conditioning when possible.  Many air conditioners have filters that trap the pollen spores. Use a HEPA room air filter to remove pollen form the indoor air you breathe.  Control of Mold Allergen Mold and fungi can grow on a variety of surfaces provided certain temperature and moisture conditions exist.  Outdoor molds grow on plants, decaying vegetation and soil.  The major outdoor mold, Alternaria and Cladosporium, are found in very high numbers during hot and dry conditions.  Generally, a late Summer - Fall peak is seen for common outdoor fungal spores.  Rain will temporarily lower outdoor mold spore count, but counts rise rapidly when the rainy period ends.  The most important indoor molds are Aspergillus and Penicillium.  Dark, humid and poorly ventilated basements are ideal sites for mold growth.  The next most common sites of mold growth are the bathroom and the kitchen.  Outdoor [employer] Use air conditioning and keep windows closed Avoid exposure to decaying vegetation. Avoid leaf raking. Avoid grain handling. Consider wearing a face mask if working in [employer]  areas.  Indoor Mold Control Maintain humidity below 50%. Clean washable surfaces with 5% bleach solution. Remove sources e.g. Contaminated carpets.

## 2024-03-09 NOTE — Discharge Instructions (Addendum)
 Your exam and your lab tests today are normal and reassuring.  It is possible you are having a side effect from your new psychiatric medicine which can cause drowsiness - please discuss this with the provider your prescribes these medicines for you.

## 2024-03-09 NOTE — ED Provider Notes (Signed)
 [place] [facility] AT [facility] Provider Note   CSN: [id number] Arrival date & time: 03/09/24  1322     Patient presents with: Weakness   Pamela Norris is a 23 y.o. female with a history including asthma, anxiety, ADHD, presenting for evaluation of generalized fatigue for the past 2 to 3 days and reports has been drowsier than normal.  She states she was started on a new psychiatric medication this week by her psychiatric provider, and has been sleepy since starting this medication, cannot recall the name of this medication.  She also just started her period today, and endorses left lower abdominal/pelvic pain.  She was seen by her gynecologist for this pain which she states has been present for several months this past week and underwent an ultrasound this week to further evaluate this pain.  She has not heard the results of the study yet and was concerned about possibly being pregnant.  She denies nausea or vomiting, fevers or chills.  Also denies dysuria, vaginal discharge.  She reports this pain as waxing and waning in character and has been present for several months.  She states it is currently very mild.   The history is provided by the patient.       Prior to Admission medications   Medication Sig Start Date End Date Taking? Authorizing Provider  albuterol  (VENTOLIN  HFA) 108 (90 Base) MCG/ACT inhaler Inhale 1 puff into the lungs every 6 (six) hours as needed for wheezing or shortness of breath. 02/04/24   Ambs, Arlean CHRISTELLA, FNP  atomoxetine  (STRATTERA ) 60 MG capsule Take 40 mg by mouth daily.    [provider]  budesonide -formoterol  (SYMBICORT ) 160-4.5 MCG/ACT inhaler Inhale 2 puffs into the lungs 2 (two) times daily. 02/04/24   Cari Arlean CHRISTELLA, FNP  ibuprofen  (ADVIL ) 800 MG tablet Take by mouth. 08/23/23   [provider]  levocetirizine (XYZAL ) 5 MG tablet Take 1 tablet (5 mg total) by mouth daily. 12/03/23   Jeneal Danita Macintosh, MD   medroxyPROGESTERone  (DEPO-PROVERA ) 150 MG/ML injection Inject 1 mL (150 mg total) into the muscle every 3 (three) months. 09/04/23   Gladis, Mary-Margaret, FNP  polyethylene glycol powder (GLYCOLAX /MIRALAX ) 17 GM/SCOOP powder Take 17 g by mouth daily. 02/13/24   Fleming, Zelda W, NP  topiramate (TOPAMAX) 25 MG tablet Take 25 mg by mouth daily.    [provider]    Allergies: Banana and Trazodone  and nefazodone    Review of Systems  Constitutional:  Positive for fatigue. Negative for chills and fever.  HENT:  Negative for congestion and sore throat.   Eyes: Negative.   Respiratory:  Negative for chest tightness and shortness of breath.   Cardiovascular:  Negative for chest pain.  Gastrointestinal:  Negative for abdominal pain, nausea and vomiting.  Genitourinary:  Positive for pelvic pain. Negative for dysuria, menstrual problem, vaginal discharge and vaginal pain.  Musculoskeletal:  Negative for arthralgias, joint swelling and neck pain.  Skin: Negative.  Negative for rash and wound.  Neurological:  Negative for dizziness, weakness, light-headedness, numbness and headaches.  Psychiatric/Behavioral: Negative.      Updated Vital Signs BP 125/80 (BP Location: Right Arm)   Pulse (!) 105   Temp 98.2 F (36.8 C) (Oral)   Resp 16   Ht 5' 8 (1.727 m)   Wt 63.5 kg   LMP 03/09/2024   SpO2 98%   BMI 21.29 kg/m   Physical Exam Vitals and nursing note reviewed.  Constitutional:  Appearance: Normal appearance. She is well-developed.  HENT:     Head: Normocephalic and atraumatic.     Mouth/Throat:     Mouth: Mucous membranes are moist.   Eyes:     Conjunctiva/sclera: Conjunctivae normal.    Cardiovascular:     Rate and Rhythm: Normal rate and regular rhythm.     Heart sounds: Normal heart sounds.  Pulmonary:     Effort: Pulmonary effort is normal.     Breath sounds: Normal breath sounds. No wheezing.  Abdominal:     General: Bowel sounds are normal.      Palpations: Abdomen is soft.     Tenderness: There is no abdominal tenderness. There is no guarding.  Genitourinary:    Comments: deferred  Musculoskeletal:        General: Normal range of motion.     Cervical back: Normal range of motion.   Skin:    General: Skin is warm and dry.   Neurological:     General: No focal deficit present.     Mental Status: She is alert.     (all labs ordered are listed, but only abnormal results are displayed) Labs Reviewed  CBC WITH DIFFERENTIAL/PLATELET  BASIC METABOLIC PANEL WITH GFR  URINALYSIS, ROUTINE W REFLEX MICROSCOPIC  PREGNANCY, URINE    EKG: None  Radiology: No results found.   Procedures   Medications Ordered in the ED - No data to display                                  Medical Decision Making Patient presenting with generalized fatigue and increased drowsiness since starting a new psychiatric medication this week.  She has a normal exam today laboratory tests completed are also completely normal.  I suspect this may be a side effect of her new psych med, and she was advised to discuss this with her psychiatrist.  Looking through her record she does have a history of overdose in the past, I inquired about depression, she denies SI or increased depression.  She also has complaints of left lower pelvic pain which has been present for months, she was seen by her GYN this past week for this complaint and underwent an ultrasound for this complaint.  I was able to locate this study and confirmed negative findings.  Further workup and/or imaging for this complaint deferred to her gynecologist.  She had a normal physical exam here today, no concerns for acute process.  Amount and/or Complexity of Data Reviewed External Data Reviewed: radiology.    Details: Ultrasound from [facility] 03/04/2024  IMPRESSION:  Normal transabdominal and endovaginal ultrasound of the uterus and ovaries.   Labs: ordered.    Details: Reviewed  including CBC, B met, urinalysis and urine pregnancy.  All negative.        Final diagnoses:  Other fatigue    ED Discharge Orders     None          Pamela Norris 03/11/24 2146    Pamela Elsie CROME, MD 03/12/24 [phone]

## 2024-03-09 NOTE — ED Triage Notes (Signed)
 Pt arrives ambulatory to ED Triage with c/o feeling weak for 2-3 days and sleeping a lot. States that she started her period to day. Also reports some mild abdominal pain and knee pain.

## 2024-03-09 NOTE — Progress Notes (Deleted)
   [address] [place] [address] Dept: [phone]  FOLLOW UP NOTE  Patient ID: Pamela Norris, female    DOB: 2000/10/14  Age: 23 y.o. MRN: [id number] Date of Office Visit: 03/10/2024  Assessment  Chief Complaint: No chief complaint on file.  HPI [name] is a 23 year old female who presents to the clinic for follow-up visit.  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  In the interim, she has been to the emergency department on 4 separate occasions.  Discussed the use of AI scribe software for clinical note transcription with the patient, who gave verbal consent to proceed.  History of Present Illness      Drug Allergies:  Allergies  Allergen Reactions   Banana Itching   Trazodone  And Nefazodone Itching    Physical Exam: There were no vitals taken for this visit.   Physical Exam  Diagnostics:    Assessment and Plan: No diagnosis found.  No orders of the defined types were placed in this encounter.   There are no Patient Instructions on file for this visit.  No follow-ups on file.    Thank you for the opportunity to care for this patient.  Please do not hesitate to contact me with questions.  Arlean Mutter, FNP [facility]

## 2024-03-10 ENCOUNTER — Telehealth: Payer: Self-pay | Admitting: Family Medicine

## 2024-03-10 ENCOUNTER — Ambulatory Visit: Payer: MEDICAID | Admitting: Family Medicine

## 2024-03-10 ENCOUNTER — Ambulatory Visit: Payer: MEDICAID

## 2024-03-10 ENCOUNTER — Encounter: Payer: MEDICAID | Admitting: Family Medicine

## 2024-03-10 NOTE — Telephone Encounter (Signed)
 Pamela Norris called and re scheduled her OV and injection start appointments to be in [facility], as she wants to be seen there going forward. Her Allergy  injections are here in [place] so they will need to go to [facility] before her appointment on July 10th.

## 2024-03-11 ENCOUNTER — Encounter: Payer: Self-pay | Admitting: Family Medicine

## 2024-03-11 ENCOUNTER — Ambulatory Visit (INDEPENDENT_AMBULATORY_CARE_PROVIDER_SITE_OTHER): Payer: MEDICAID | Admitting: Family Medicine

## 2024-03-11 VITALS — BP 121/77 | HR 100 | Temp 98.3°F | Ht 68.0 in | Wt 143.4 lb

## 2024-03-11 DIAGNOSIS — G479 Sleep disorder, unspecified: Secondary | ICD-10-CM | POA: Diagnosis not present

## 2024-03-11 DIAGNOSIS — F902 Attention-deficit hyperactivity disorder, combined type: Secondary | ICD-10-CM

## 2024-03-11 DIAGNOSIS — Z1159 Encounter for screening for other viral diseases: Secondary | ICD-10-CM

## 2024-03-11 DIAGNOSIS — E876 Hypokalemia: Secondary | ICD-10-CM | POA: Diagnosis not present

## 2024-03-11 DIAGNOSIS — F3481 Disruptive mood dysregulation disorder: Secondary | ICD-10-CM

## 2024-03-11 NOTE — Patient Instructions (Signed)
 Insomnia Insomnia is a sleep disorder that makes it difficult to fall asleep or stay asleep. Insomnia can cause fatigue, low energy, difficulty concentrating, mood swings, and poor performance at work or school. There are three different ways to classify insomnia: Difficulty falling asleep. Difficulty staying asleep. Waking up too early in the morning. Any type of insomnia can be long-term (chronic) or short-term (acute). Both are common. Short-term insomnia usually lasts for 3 months or less. Chronic insomnia occurs at least three times a week for longer than 3 months. What are the causes? Insomnia may be caused by another condition, situation, or substance, such as: Having certain mental health conditions, such as anxiety and depression. Using caffeine, alcohol, tobacco, or drugs. Having gastrointestinal conditions, such as gastroesophageal reflux disease (GERD). Having certain medical conditions. These include: Asthma. Alzheimer's disease. Stroke. Chronic pain. An overactive thyroid gland (hyperthyroidism). Other sleep disorders, such as restless legs syndrome and sleep apnea. Menopause. Sometimes, the cause of insomnia may not be known. What increases the risk? Risk factors for insomnia include: Gender. Females are affected more often than males. Age. Insomnia is more common as people get older. Stress and certain medical and mental health conditions. Lack of exercise. Having an irregular work schedule. This may include working night shifts and traveling between different time zones. What are the signs or symptoms? If you have insomnia, the main symptom is having trouble falling asleep or having trouble staying asleep. This may lead to other symptoms, such as: Feeling tired or having low energy. Feeling nervous about going to sleep. Not feeling rested in the morning. Having trouble concentrating. Feeling irritable, anxious, or depressed. How is this diagnosed? This condition  may be diagnosed based on: Your symptoms and medical history. Your health care provider may ask about: Your sleep habits. Any medical conditions you have. Your mental health. A physical exam. How is this treated? Treatment for insomnia depends on the cause. Treatment may focus on treating an underlying condition that is causing the insomnia. Treatment may also include: Medicines to help you sleep. Counseling or therapy. Lifestyle adjustments to help you sleep better. Follow these instructions at home: Eating and drinking  Limit or avoid alcohol, caffeinated beverages, and products that contain nicotine and tobacco, especially close to bedtime. These can disrupt your sleep. Do not eat a large meal or eat spicy foods right before bedtime. This can lead to digestive discomfort that can make it hard for you to sleep. Sleep habits  Keep a sleep diary to help you and your health care provider figure out what could be causing your insomnia. Write down: When you sleep. When you wake up during the night. How well you sleep and how rested you feel the next day. Any side effects of medicines you are taking. What you eat and drink. Make your bedroom a dark, comfortable place where it is easy to fall asleep. Put up shades or blackout curtains to block light from outside. Use a white noise machine to block noise. Keep the temperature cool. Limit screen use before bedtime. This includes: Not watching TV. Not using your smartphone, tablet, or computer. Stick to a routine that includes going to bed and waking up at the same times every day and night. This can help you fall asleep faster. Consider making a quiet activity, such as reading, part of your nighttime routine. Try to avoid taking naps during the day so that you sleep better at night. Get out of bed if you are still awake after  15 minutes of trying to sleep. Keep the lights down, but try reading or doing a quiet activity. When you feel  sleepy, go back to bed. General instructions Take over-the-counter and prescription medicines only as told by your health care provider. Exercise regularly as told by your health care provider. However, avoid exercising in the hours right before bedtime. Use relaxation techniques to manage stress. Ask your health care provider to suggest some techniques that may work well for you. These may include: Breathing exercises. Routines to release muscle tension. Visualizing peaceful scenes. Make sure that you drive carefully. Do not drive if you feel very sleepy. Keep all follow-up visits. This is important. Contact a health care provider if: You are tired throughout the day. You have trouble in your daily routine due to sleepiness. You continue to have sleep problems, or your sleep problems get worse. Get help right away if: You have thoughts about hurting yourself or someone else. Get help right away if you feel like you may hurt yourself or others, or have thoughts about taking your own life. Go to your nearest emergency room or: Call 911. Call the National Suicide Prevention Lifeline at (906)021-1611 or 988. This is open 24 hours a day. Text the Crisis Text Line at 325-069-2793. Summary Insomnia is a sleep disorder that makes it difficult to fall asleep or stay asleep. Insomnia can be long-term (chronic) or short-term (acute). Treatment for insomnia depends on the cause. Treatment may focus on treating an underlying condition that is causing the insomnia. Keep a sleep diary to help you and your health care provider figure out what could be causing your insomnia. This information is not intended to replace advice given to you by your health care provider. Make sure you discuss any questions you have with your health care provider. Document Revised: 08/12/2021 Document Reviewed: 08/12/2021 Elsevier Patient Education  2024 [employer].

## 2024-03-11 NOTE — Progress Notes (Signed)
 Acute Office Visit  Subjective:     Patient ID: Pamela Norris, female    DOB: [birth year], 23 y.o.   MRN: [id number]  Chief Complaint  Patient presents with   Medical Management of Chronic Issues    HPI Patient is in today to establish care as her PCP is leaving the practice.   [facility] [place] She has an appt in a few weeks for follow up with them. Denies SI. She is complaint with Strattera  for ADHD. She has not been taking prescribed medication for depression, anxiety, DMDD. She as recently seen in the ER for depression and prescribed Abilify . She is not taking this. Reports that this made her sleepy. She has been having difficulty sleeping for the last few weeks. Trouble falling and staying asleep. She reports anxiety at night, worrying and thinking about a lot of different things. She feels the need to sleep but is unable to. She has tried melatonin without relief. She has tried hydroxyzine  without relief as well. Allergic to trazodone . Hasn't discussed difficulty sleeping with psychiatrist.   Interested in nexplanon. She discussed this with her OB at [facility] but prefers not to return there. She would prefer to do this here at our office.   Hx of hypokalemia. Was previously on supplement but no longer is.      03/11/2024    2:40 PM 11/20/2023    1:22 PM 10/15/2023    3:56 PM  Depression screen PHQ 2/9  Decreased Interest 2 0 0  Down, Depressed, Hopeless 2 1 1   PHQ - 2 Score 4 1 1   Altered sleeping 3 3 3   Tired, decreased energy 3 1 2   Change in appetite 2 2 2   Feeling bad or failure about yourself  1 3 3   Trouble concentrating 0 3 3  Moving slowly or fidgety/restless 0  3  Suicidal thoughts 0 0 1  PHQ-9 Score 13 13 18   Difficult doing work/chores Somewhat difficult Not difficult at all Not difficult at all      03/11/2024    2:41 PM 11/20/2023    1:22 PM 10/15/2023    3:55 PM 09/03/2023    2:00 PM  GAD 7 : Generalized Anxiety Score   Nervous, Anxious, on Edge 1 3 3 3   Control/stop worrying 1 3 3 3   Worry too much - different things 2 3 3 3   Trouble relaxing 1 3 3 2   Restless 2 3 3 3   Easily annoyed or irritable 2 3 3 3   Afraid - awful might happen 1 3 3  0  Total GAD 7 Score 10 21 21 17   Anxiety Difficulty Somewhat difficult Somewhat difficult Not difficult at all Not difficult at all     ROS As per HPI.      Objective:    BP 121/77   Pulse 100   Temp 98.3 F (36.8 C) (Temporal)   Ht 5' 8 (1.727 m)   Wt 143 lb 6.4 oz (65 kg)   LMP 03/09/2024   SpO2 97%   BMI 21.80 kg/m    Physical Exam Vitals and nursing note reviewed.  Constitutional:      General: She is not in acute distress.    Appearance: She is not ill-appearing, toxic-appearing or diaphoretic.   Cardiovascular:     Rate and Rhythm: Normal rate and regular rhythm.     Heart sounds: Normal heart sounds. No murmur heard. Pulmonary:     Effort:  Pulmonary effort is normal. No respiratory distress.     Breath sounds: Normal breath sounds. No wheezing, rhonchi or rales.   Musculoskeletal:     Right lower leg: No edema.     Left lower leg: No edema.   Skin:    General: Skin is warm and dry.   Neurological:     General: No focal deficit present.     Mental Status: She is alert and oriented to person, place, and time.   Psychiatric:        Mood and Affect: Mood normal.        Behavior: Behavior normal.     No results found for any visits on 03/11/24.      Assessment & Plan:   Nozomi was seen today for medical management of chronic issues.  Diagnoses and all orders for this visit:  Hypokalemia BMP pending.  -     BMP8+EGFR  Attention deficit hyperactivity disorder (ADHD), combined type Disruptive mood dysregulation disorder (HCC) On Strattera  with BH. Depression and anxiety symptoms are not well controlled. Denies SI. Follow up with [facility] for management.   Difficulty sleeping Discussed sleep hygiene. Discussed need to  follow up with [facility] regarding management of depression and anxiety which can impact sleep.  -     TSH -     CBC with Differential/Platelet  Need for hepatitis C screening test -     HCV Ab w Reflex to Quant PCR  Return for schedule consult with a provider who can do nexplanon.  The patient indicates understanding of these issues and agrees with the plan.  Annabella CHRISTELLA Search, FNP

## 2024-03-12 LAB — CBC WITH DIFFERENTIAL/PLATELET
Basophils Absolute: 0.1 10*3/uL (ref 0.0–0.2)
Basos: 1 %
EOS (ABSOLUTE): 0.1 10*3/uL (ref 0.0–0.4)
Eos: 1 %
Hematocrit: 41.6 % (ref 34.0–46.6)
Hemoglobin: 13.3 g/dL (ref 11.1–15.9)
Immature Grans (Abs): 0 10*3/uL (ref 0.0–0.1)
Immature Granulocytes: 0 %
Lymphocytes Absolute: 1.6 10*3/uL (ref 0.7–3.1)
Lymphs: 21 %
MCH: 28.7 pg (ref 26.6–33.0)
MCHC: 32 g/dL (ref 31.5–35.7)
MCV: 90 fL (ref 79–97)
Monocytes Absolute: 0.7 10*3/uL (ref 0.1–0.9)
Monocytes: 10 %
Neutrophils Absolute: 5 10*3/uL (ref 1.4–7.0)
Neutrophils: 67 %
Platelets: 395 10*3/uL (ref 150–450)
RBC: 4.63 x10E6/uL (ref 3.77–5.28)
RDW: 12.4 % (ref 11.7–15.4)
WBC: 7.4 10*3/uL (ref 3.4–10.8)

## 2024-03-12 LAB — BMP8+EGFR
BUN/Creatinine Ratio: 22 (ref 9–23)
BUN: 12 mg/dL (ref 6–20)
CO2: 18 mmol/L — AB (ref 20–29)
Calcium: 9.4 mg/dL (ref 8.7–10.2)
Chloride: 105 mmol/L (ref 96–106)
Creatinine, Ser: 0.54 mg/dL — AB (ref 0.57–1.00)
Glucose: 91 mg/dL (ref 70–99)
Potassium: 4.1 mmol/L (ref 3.5–5.2)
Sodium: 139 mmol/L (ref 134–144)
eGFR: 135 mL/min/{1.73_m2} (ref >59)

## 2024-03-12 LAB — HCV INTERPRETATION

## 2024-03-12 LAB — HCV AB W REFLEX TO QUANT PCR: HCV Ab: NONREACTIVE

## 2024-03-12 LAB — TSH: TSH: 0.797 u[IU]/mL (ref 0.450–4.500)

## 2024-03-14 ENCOUNTER — Ambulatory Visit: Payer: Self-pay | Admitting: Family Medicine

## 2024-03-15 NOTE — Telephone Encounter (Signed)
 Received paperwork and vials at the OR office. Paperwork has been placed at the OR office in the blackbin beside the printer.

## 2024-03-23 ENCOUNTER — Ambulatory Visit (INDEPENDENT_AMBULATORY_CARE_PROVIDER_SITE_OTHER): Payer: MEDICAID | Admitting: Neurology

## 2024-03-23 ENCOUNTER — Encounter: Payer: Self-pay | Admitting: Neurology

## 2024-03-23 VITALS — BP 117/76 | HR 70 | Resp 15 | Ht 68.0 in | Wt 139.4 lb

## 2024-03-23 DIAGNOSIS — R569 Unspecified convulsions: Secondary | ICD-10-CM | POA: Diagnosis not present

## 2024-03-23 DIAGNOSIS — G43709 Chronic migraine without aura, not intractable, without status migrainosus: Secondary | ICD-10-CM | POA: Diagnosis not present

## 2024-03-23 MED ORDER — SUMATRIPTAN SUCCINATE 50 MG PO TABS: 50.0000 mg | ORAL_TABLET | ORAL | 0 refills | Status: DC | PRN

## 2024-03-23 NOTE — Progress Notes (Signed)
 [facility]  PATIENT: Pamela Norris DOB: [birth year]  REQUESTING CLINICIAN: Cathlene Marry Lenis* HISTORY FROM: Patient  REASON FOR VISIT: Seizure like activity    HISTORICAL  CHIEF COMPLAINT:  Chief Complaint  Patient presents with   New Patient (Initial Visit)    Rm16, alone, internal referral for seizure like activity: last episode happened on 03/19/24. Pt stated that her cousin told her she had convulsions    HISTORY OF PRESENT ILLNESS:  Discussed the use of AI scribe software for clinical note transcription with the patient, who gave verbal consent to proceed.  History of Present Illness   This is a patient with history of ADHD, OCD, Depressio/Anxiety, Migraines who presents with episodes of staring, episode of shaking, crying, and laughing that has been going on since childhood.   The patient experiences episodes of simultaneous shaking, crying, and laughing, each lasting about ten to fifteen minutes. These episodes have been occurring since childhood, with a recent increase in frequency to almost daily occurrences last week, compared to previously happening once a month or once a week. High-stress situations, such as family conflicts, often trigger these episodes. During these episodes, she feels her heart racing and fear having a heart attack. She also experiences zoning out and is aware of the episodes as they occur, remembering them afterward. There is no known family history of seizures, but her maternal grandmother had a condition involving excess water  in the brain. Previous neurological evaluations, including an MRI and EEG, did not indicate epileptic seizures.  She has a history of migraines, experiencing them daily. She is currently taking Topamax, which provides minimal relief for her migraines. She has not increased the dosage due to satisfactory sleep quality. She has not tried other medications for migraines, and her mother, who also suffers  from migraines, uses BC powders, which she avoids due to potential liver issues. She has previously taken Tylenol  daily, which resulted in elevated liver levels.  She has a history of ADHD and ODD, OCD, which she feels contributes to her inability to hold a job due to being 'all over the place.' She currently resides at her roommate's mom's house, which provides a structured environment with a curfew, helping to reduce exposure to stressful situations.      Handedness: Right handed   Onset: Since teenager year   Seizure Type: Staring into space, zoning out. Episodes of crying and laughing at the same time  Current frequency: Monthly, sometimes multiple times per week   Any injuries from seizures: Denies   Seizure risk factors: None reported   Previous ASMs: Topiramate low dose for migraines   Currenty ASMs: Topiramate 25 mg for migraines   ASMs side effects: Denies   Brain Images: No acute abnormalities   Previous EEGs: Not available fore review    OTHER MEDICAL CONDITIONS: ADHD, OCD, Depression, Anxiety, Migraines   REVIEW OF SYSTEMS: Full 14 system review of systems performed and negative with exception of: As noted in the HPI   ALLERGIES: Allergies  Allergen Reactions   Banana Itching   Trazodone  And Nefazodone Itching    HOME MEDICATIONS: Outpatient Medications Prior to Visit  Medication Sig Dispense Refill   albuterol  (VENTOLIN  HFA) 108 (90 Base) MCG/ACT inhaler Inhale 1 puff into the lungs every 6 (six) hours as needed for wheezing or shortness of breath. 8 g 1   atomoxetine  (STRATTERA ) 60 MG capsule Take 40 mg by mouth daily.     budesonide -formoterol  (SYMBICORT ) 160-4.5 MCG/ACT inhaler Inhale 2  puffs into the lungs 2 (two) times daily. 1 each 5   ibuprofen  (ADVIL ) 800 MG tablet Take by mouth.     levocetirizine (XYZAL ) 5 MG tablet Take 1 tablet (5 mg total) by mouth daily. 30 tablet 5   medroxyPROGESTERone  (DEPO-PROVERA ) 150 MG/ML injection Inject 1 mL (150 mg  total) into the muscle every 3 (three) months. 1 mL 3   polyethylene glycol powder (GLYCOLAX /MIRALAX ) 17 GM/SCOOP powder Take 17 g by mouth daily. 3350 g 1   topiramate (TOPAMAX) 25 MG tablet Take 25 mg by mouth daily.     No facility-administered medications prior to visit.    PAST MEDICAL HISTORY: Past Medical History:  Diagnosis Date   ADHD (attention deficit hyperactivity disorder)    Anxiety    Asthma    Eustachian tube dysfunction 07/2010   Headache    OCD (obsessive compulsive disorder)    mild    ODD (oppositional defiant disorder)    some     PAST SURGICAL HISTORY: Past Surgical History:  Procedure Laterality Date   TYMPANOSTOMY TUBE PLACEMENT      FAMILY HISTORY: Family History  Adopted: Yes  Problem Relation Age of Onset   Migraines Mother    Asthma Mother    Alcohol abuse Mother    Drug abuse Mother    Asthma Father    Alcohol abuse Father    Drug abuse Father    Asthma Paternal Aunt     SOCIAL HISTORY: Social History   Socioeconomic History   Marital status: Significant Other    Spouse name: Not on file   Number of children: 0   Years of education: Not on file   Highest education level: 8th grade  Occupational History   Not on file  Tobacco Use   Smoking status: Never    Passive exposure: Never (Aunt and uncle)   Smokeless tobacco: Never  Vaping Use   Vaping status: Every Day  Substance and Sexual Activity   Alcohol use: Never   Drug use: Never   Sexual activity: Not Currently    Birth control/protection: Injection  Other Topics Concern   Not on file  Social History Narrative   Not on file   Social Drivers of Health   Financial Resource Strain: Medium Risk (10/28/2023)   Overall Financial Resource Strain (CARDIA)    Difficulty of Paying Living Expenses: Somewhat hard  Food Insecurity: No Food Insecurity (10/28/2023)   Hunger Vital Sign    Worried About Running Out of Food in the Last Year: Never true    Ran Out of Food in the Last  Year: Never true  Transportation Needs: Unknown (10/28/2023)   PRAPARE - Administrator, Civil Service (Medical): Not on file    Lack of Transportation (Non-Medical): No  Physical Activity: Sufficiently Active (10/28/2023)   Exercise Vital Sign    Days of Exercise per Week: 7 days    Minutes of Exercise per Session: 150+ min  Stress: Stress Concern Present (10/28/2023)   [employer] of Occupational Health - Occupational Stress Questionnaire    Feeling of Stress : Very much  Social Connections: Unknown (10/28/2023)   Social Connection and Isolation Panel    Frequency of Communication with Friends and Family: More than three times a week    Frequency of Social Gatherings with Friends and Family: More than three times a week    Attends Religious Services: More than 4 times per year    Active Member of Clubs or  Organizations: Yes    Attends Engineer, structural: More than 4 times per year    Marital Status: Patient declined  Intimate Partner Violence: Not At Risk (08/23/2023)   Received from [facility]   HITS    Over the last 12 months how often did your partner physically hurt you?: Never    Over the last 12 months how often did your partner insult you or talk down to you?: Never    Over the last 12 months how often did your partner threaten you with physical harm?: Never    Over the last 12 months how often did your partner scream or curse at you?: Never     PHYSICAL EXAM  GENERAL EXAM/CONSTITUTIONAL: Vitals:  Vitals:   03/23/24 1504  BP: 117/76  Pulse: 70  Resp: 15  Weight: 139 lb 6.4 oz (63.2 kg)  Height: 5' 8 (1.727 m)   Body mass index is 21.2 kg/m. Wt Readings from Last 3 Encounters:  03/23/24 139 lb 6.4 oz (63.2 kg)  03/11/24 143 lb 6.4 oz (65 kg)  03/09/24 140 lb (63.5 kg)   Patient is in no distress; well developed, nourished and groomed; neck is supple  MUSCULOSKELETAL: Gait, strength, tone, movements noted in Neurologic exam  below  NEUROLOGIC: MENTAL STATUS:      No data to display         awake, alert, oriented to person, place and time recent and remote memory intact normal attention and concentration language fluent, comprehension intact, naming intact fund of knowledge appropriate  CRANIAL NERVE:  2nd, 3rd, 4th, 6th - Visual fields full to confrontation, extraocular muscles intact, no nystagmus 5th - facial sensation symmetric 7th - facial strength symmetric 8th - hearing intact 9th - palate elevates symmetrically, uvula midline 11th - shoulder shrug symmetric 12th - tongue protrusion midline  MOTOR:  normal bulk and tone, full strength in the BUE, BLE  SENSORY:  normal and symmetric to light touch  COORDINATION:  finger-nose-finger, fine finger movements normal  GAIT/STATION:  normal     DIAGNOSTIC DATA (LABS, IMAGING, TESTING) - I reviewed patient records, labs, notes, testing and imaging myself where available.  Lab Results  Component Value Date   WBC 7.4 03/11/2024   HGB 13.3 03/11/2024   HCT 41.6 03/11/2024   MCV 90 03/11/2024   PLT 395 03/11/2024      Component Value Date/Time   NA 139 03/11/2024 1509   K 4.1 03/11/2024 1509   CL 105 03/11/2024 1509   CO2 18 (L) 03/11/2024 1509   GLUCOSE 91 03/11/2024 1509   GLUCOSE 83 03/09/2024 1628   BUN 12 03/11/2024 1509   CREATININE 0.54 (L) 03/11/2024 1509   CALCIUM  9.4 03/11/2024 1509   PROT 7.5 10/15/2023 1611   ALBUMIN 4.5 10/15/2023 1611   AST 19 10/15/2023 1611   ALT 22 10/15/2023 1611   ALKPHOS 103 10/15/2023 1611   BILITOT <0.2 10/15/2023 1611   GFRNONAA >60 03/09/2024 1628   GFRAA NOT CALCULATED 05/03/2020 2150   Lab Results  Component Value Date   CHOL 179 (H) 10/06/2021   HDL 90 10/06/2021   LDLCALC 79 10/06/2021   TRIG 51 10/06/2021   Lab Results  Component Value Date   HGBA1C 5.2 10/15/2023   No results found for: VITAMINB12 Lab Results  Component Value Date   TSH 0.797 03/11/2024     MRI Brain 04/25/2022 Evaluation is limited by artifact, suspected to be related to dental hardware, which renders several sequences  nondiagnostic or limited and particularly limits evaluation of the anterior inferior frontal lobe. In addition, there is motion on several sequences. Within these limitations, no definite focal mass is seen within the pituitary. No acute intracranial process    ASSESSMENT AND PLAN  23 y.o. year old female  with      Non-epileptic stress-induced seizures Experiences episodes of shaking, crying, and laughing triggered by high-stress situations, occurring almost daily. She is aware of these events, does not lose consciousness and can recall it. Previous MRI and EEG did not show evidence of epileptic seizures.  - Advise to limit exposure to stressors - Consider repeat EEG to confirm non-epileptic nature of seizures  Migraine Experiences daily migraines, partially managed with Topamax prescribed by psychiatrist. Current dosage primarily for sleep, requiring more effective migraine control. No prior use of migraine-specific medications. - Prescribe sumatriptan  (Imitrex ) for acute migraine attacks, limit ten tablets per month - Advise to discuss increasing Topamax dosage with psychiatrist for better migraine control         1. Seizure-like activity (HCC)   2. Nonepileptic episode (HCC)   3. Chronic migraine without aura without status migrainosus, not intractable     Patient Instructions  Routine EEG, I will contact you to go over the results  Trial of Sumatriptan  for the headaches  Continue to follow up with your psychiatrist  Return as needed    Per [place] statutes, patients with seizures are not allowed to drive until they have been seizure-free for six months.  Other recommendations include using caution when using heavy equipment or power tools. Avoid working on ladders or at heights. Take showers instead of baths.  Do not swim alone.   Ensure the water  temperature is not too high on the home water  heater. Do not go swimming alone. Do not lock yourself in a room alone (i.e. bathroom). When caring for infants or small children, sit down when holding, feeding, or changing them to minimize risk of injury to the child in the event you have a seizure. Maintain good sleep hygiene. Avoid alcohol.  Also recommend adequate sleep, hydration, good diet and minimize stress.   During the Seizure  - First, ensure adequate ventilation and place patients on the floor on their left side  Loosen clothing around the neck and ensure the airway is patent. If the patient is clenching the teeth, do not force the mouth open with any object as this can cause severe damage - Remove all items from the surrounding that can be hazardous. The patient may be oblivious to what's happening and may not even know what he or she is doing. If the patient is confused and wandering, either gently guide him/her away and block access to outside areas - Reassure the individual and be comforting - Call 911. In most cases, the seizure ends before EMS arrives. However, there are cases when seizures may last over 3 to 5 minutes. Or the individual may have developed breathing difficulties or severe injuries. If a pregnant patient or a person with diabetes develops a seizure, it is prudent to call an ambulance. - Finally, if the patient does not regain full consciousness, then call EMS. Most patients will remain confused for about 45 to 90 minutes after a seizure, so you must use judgment in calling for help. - Avoid restraints but make sure the patient is in a bed with padded side rails - Place the individual in a lateral position with the neck slightly flexed; this will  help the saliva drain from the mouth and prevent the tongue from falling backward - Remove all nearby furniture and other hazards from the area - Provide verbal assurance as the individual is regaining  consciousness - Provide the patient with privacy if possible - Call for help and start treatment as ordered by the caregiver   After the Seizure (Postictal Stage)  After a seizure, most patients experience confusion, fatigue, muscle pain and/or a headache. Thus, one should permit the individual to sleep. For the next few days, reassurance is essential. Being calm and helping reorient the person is also of importance.  Most seizures are painless and end spontaneously. Seizures are not harmful to others but can lead to complications such as stress on the lungs, brain and the heart. Individuals with prior lung problems may develop labored breathing and respiratory distress.    Discussed Patients with epilepsy have a small risk of sudden unexpected death, a condition referred to as sudden unexpected death in epilepsy (SUDEP). SUDEP is defined specifically as the sudden, unexpected, witnessed or unwitnessed, nontraumatic and nondrowning death in patients with epilepsy with or without evidence for a seizure, and excluding documented status epilepticus, in which post mortem examination does not reveal a structural or toxicologic cause for death     Orders Placed This Encounter  Procedures   EEG adult    Meds ordered this encounter  Medications   SUMAtriptan  (IMITREX ) 50 MG tablet    Sig: Take 1 tablet (50 mg total) by mouth every 2 (two) hours as needed for migraine. May repeat in 2 hours if headache persists or recurs.    Dispense:  10 tablet    Refill:  0    Return if symptoms worsen or fail to improve.    Pastor Falling, MD 03/23/2024, 4:16 PM  [facility] [address] [place], [place] [address] [phone]

## 2024-03-23 NOTE — Progress Notes (Unsigned)
 Follow Up Note  RE: Pamela Norris MRN: [id number] DOB: [birth year] Date of Office Visit: 03/24/2024  Referring provider: Cathlene Marry Lenis* Primary care provider: Joesph Annabella CHRISTELLA, FNP  Chief Complaint: No chief complaint on file.  History of Present Illness: I had the pleasure of seeing Pamela Norris for a follow up visit at the [facility] on 03/24/2024. She is a 23 y.o. female, who is being followed for asthma, allergic rhinoconjunctivitis, atopic dermatitis, food allergy . Her previous allergy  office visit was on 02/04/2024 with Arlean Mutter, FNP. Today is a new complaint visit of ***.  Discussed the use of AI scribe software for clinical note transcription with the patient, who gave verbal consent to proceed.  History of Present Illness            ***  Assessment and Plan: Altha is a 23 y.o. female with: Asthma Begin Spiriva 1.25 mcg - 2 puffs once a day to prevent cough or wheeze Continue Symbicort  160-2 puffs twice a day with a spacer to prevent cough or wheeze Continue albuterol  2 puffs once every 4 hours if needed for cough or wheeze You may use albuterol  2 puffs 5 to 15 minutes before activity to decrease cough or wheeze   Allergic rhinitis Continue allergen avoidance measures directed toward grass pollen, weed pollen, tree pollen, indoor mold, and outdoor molds Continue Xyzal  5 mg once a day if needed for a runny nose or itch Begin Flonase  2 sprays in each nostril once a day if needed for nasal symptoms Consider saline nasal rinses as needed for nasal symptoms. Use this before any medicated nasal sprays for best result Consider allergen immunotherapy if your symptoms are not well-controlled with the treatment plan as listed above.  Written information provided at today's visit.  Make an appointment at your next visit to begin allergen immunotherapy. Your breathing must be well controlled before beginning allergy  injections   Allergic  conjunctivitis Begin cromolyn  1 to 2 drops in each eye up to 4 times a day if needed for red or itchy eyes   Atopic dermatitis Continue a twice a day moisturizing routine Begin desonide  0.05% ointment to reddened itchy areas up to twice a day if needed.  Do not use this medication longer than 2 weeks in a row   Food allergy  Continue to avoid banana.  In case of an allergic reaction, take Benadryl  50 mg every 4 hours, and if life-threatening symptoms occur, inject with EpiPen  0.3 mg.   Snoring Continue with your sleep study that was ordered at a previous visit   Call the clinic if this treatment plan is not working well for you. Assessment and Plan              No follow-ups on file.  No orders of the defined types were placed in this encounter.  Lab Orders  No laboratory test(s) ordered today    Diagnostics: Spirometry:  Tracings reviewed. Her effort: {Blank single:19197::Good reproducible efforts.,It was hard to get consistent efforts and there is a question as to whether this reflects a maximal maneuver.,Poor effort, data can not be interpreted.} FVC: ***L FEV1: ***L, ***% predicted FEV1/FVC ratio: ***% Interpretation: {Blank single:19197::Spirometry consistent with mild obstructive disease,Spirometry consistent with moderate obstructive disease,Spirometry consistent with severe obstructive disease,Spirometry consistent with possible restrictive disease,Spirometry consistent with mixed obstructive and restrictive disease,Spirometry uninterpretable due to technique,Spirometry consistent with normal pattern,No overt abnormalities noted given today's efforts}.  Please see scanned spirometry results for details.  Skin Testing: {Blank single:19197::Select foods,Environmental allergy  panel,Environmental allergy  panel and select foods,Food allergy  panel,None,Deferred due to recent antihistamines use}. *** Results discussed with  patient/family.   Medication List:  Current Outpatient Medications  Medication Sig Dispense Refill  . albuterol  (VENTOLIN  HFA) 108 (90 Base) MCG/ACT inhaler Inhale 1 puff into the lungs every 6 (six) hours as needed for wheezing or shortness of breath. 8 g 1  . atomoxetine  (STRATTERA ) 60 MG capsule Take 40 mg by mouth daily.    . budesonide -formoterol  (SYMBICORT ) 160-4.5 MCG/ACT inhaler Inhale 2 puffs into the lungs 2 (two) times daily. 1 each 5  . ibuprofen  (ADVIL ) 800 MG tablet Take by mouth.    . levocetirizine (XYZAL ) 5 MG tablet Take 1 tablet (5 mg total) by mouth daily. 30 tablet 5  . medroxyPROGESTERone  (DEPO-PROVERA ) 150 MG/ML injection Inject 1 mL (150 mg total) into the muscle every 3 (three) months. 1 mL 3  . polyethylene glycol powder (GLYCOLAX /MIRALAX ) 17 GM/SCOOP powder Take 17 g by mouth daily. 3350 g 1  . topiramate (TOPAMAX) 25 MG tablet Take 25 mg by mouth daily.     No current facility-administered medications for this visit.   Allergies: Allergies  Allergen Reactions  . Banana Itching  . Trazodone  And Nefazodone Itching   I reviewed her past medical history, social history, family history, and environmental history and no significant changes have been reported from her previous visit.  Review of Systems  Constitutional:  Negative for appetite change, chills, fever and unexpected weight change.  HENT:  Negative for congestion and rhinorrhea.   Eyes:  Negative for itching.  Respiratory:  Negative for cough, chest tightness, shortness of breath and wheezing.   Cardiovascular:  Negative for chest pain.  Gastrointestinal:  Negative for abdominal pain.  Genitourinary:  Negative for difficulty urinating.  Skin:  Negative for rash.  Allergic/Immunologic: Positive for environmental allergies.  Neurological:  Negative for headaches.    Objective: LMP 03/09/2024  There is no height or weight on file to calculate BMI. Physical Exam Vitals and nursing note reviewed.   Constitutional:      Appearance: Normal appearance. She is well-developed.  HENT:     Head: Normocephalic and atraumatic.     Right Ear: Tympanic membrane and external ear normal.     Left Ear: Tympanic membrane and external ear normal.     Nose: Nose normal.     Mouth/Throat:     Mouth: Mucous membranes are moist.     Pharynx: Oropharynx is clear.  Eyes:     Conjunctiva/sclera: Conjunctivae normal.  Cardiovascular:     Rate and Rhythm: Normal rate and regular rhythm.     Heart sounds: Normal heart sounds. No murmur heard.    No friction rub. No gallop.  Pulmonary:     Effort: Pulmonary effort is normal.     Breath sounds: Normal breath sounds. No wheezing, rhonchi or rales.  Musculoskeletal:     Cervical back: Neck supple.  Skin:    General: Skin is warm.     Findings: No rash.  Neurological:     Mental Status: She is alert and oriented to person, place, and time.  Psychiatric:        Behavior: Behavior normal.   Previous notes and tests were reviewed. The plan was reviewed with the patient/family, and all questions/concerned were addressed.  It was my pleasure to see Cambrie today and participate in her care. Please feel free to contact me with any questions or concerns.  Sincerely,  Orlan Cramp, DO Allergy  & Immunology  [facility]  [place] [place] office: [phone] [facility] office: [phone]

## 2024-03-23 NOTE — Patient Instructions (Signed)
 Routine EEG, I will contact you to go over the results  Trial of Sumatriptan  for the headaches  Continue to follow up with your psychiatrist  Return as needed

## 2024-03-24 ENCOUNTER — Ambulatory Visit: Payer: MEDICAID

## 2024-03-24 ENCOUNTER — Ambulatory Visit: Payer: Self-pay

## 2024-03-24 ENCOUNTER — Ambulatory Visit (INDEPENDENT_AMBULATORY_CARE_PROVIDER_SITE_OTHER): Payer: MEDICAID | Admitting: Allergy

## 2024-03-24 ENCOUNTER — Encounter: Payer: Self-pay | Admitting: Allergy

## 2024-03-24 VITALS — BP 116/78 | HR 71 | Temp 98.3°F | Resp 16 | Ht 68.0 in | Wt 139.4 lb

## 2024-03-24 DIAGNOSIS — H1013 Acute atopic conjunctivitis, bilateral: Secondary | ICD-10-CM

## 2024-03-24 DIAGNOSIS — H1010 Acute atopic conjunctivitis, unspecified eye: Secondary | ICD-10-CM

## 2024-03-24 DIAGNOSIS — T781XXD Other adverse food reactions, not elsewhere classified, subsequent encounter: Secondary | ICD-10-CM

## 2024-03-24 DIAGNOSIS — F419 Anxiety disorder, unspecified: Secondary | ICD-10-CM

## 2024-03-24 DIAGNOSIS — J302 Other seasonal allergic rhinitis: Secondary | ICD-10-CM | POA: Diagnosis not present

## 2024-03-24 DIAGNOSIS — R0602 Shortness of breath: Secondary | ICD-10-CM

## 2024-03-24 DIAGNOSIS — R0789 Other chest pain: Secondary | ICD-10-CM | POA: Diagnosis not present

## 2024-03-24 DIAGNOSIS — R002 Palpitations: Secondary | ICD-10-CM

## 2024-03-24 DIAGNOSIS — J4540 Moderate persistent asthma, uncomplicated: Secondary | ICD-10-CM | POA: Diagnosis not present

## 2024-03-24 DIAGNOSIS — J3089 Other allergic rhinitis: Secondary | ICD-10-CM

## 2024-03-24 MED ORDER — FEXOFENADINE HCL 180 MG PO TABS: 180.0000 mg | ORAL_TABLET | Freq: Every morning | ORAL | 3 refills | Status: AC

## 2024-03-24 MED ORDER — LEVOCETIRIZINE DIHYDROCHLORIDE 5 MG PO TABS: 5.0000 mg | ORAL_TABLET | Freq: Every evening | ORAL | 5 refills | Status: AC

## 2024-03-24 NOTE — Telephone Encounter (Signed)
 Spoke to patient.  She states her symptoms are non-emergent. Her asthma doctor wants her to follow up to see if there is other possible causes for patients SOB and chest tightness. Appointment made with DOD next Friday.

## 2024-03-24 NOTE — Telephone Encounter (Signed)
 FYI Only or Action Required?: FYI only for provider.  Patient was last seen in primary care on 03/11/2024 by Joesph Annabella HERO, FNP.  Called Nurse Triage reporting Chest Pain and Shortness of Breath.  Symptoms began several days ago.  Interventions attempted: Nothing.  Symptoms are: gradually worsening.  Triage Disposition: Go to ED Now (Notify PCP)  Patient/caregiver understands and will follow disposition?: Yes   Copied from CRM [id number]. Topic: Clinical - Red Word Triage >> Mar 24, 2024 12:43 PM Willma R wrote: Red Word that prompted transfer to Nurse Triage: Patient states she has been having chest pains and shortness of breath. Went to her asthma doctor today and did a breathing test with and without an inhaler and the results were the same so they do not think it is asthma related.  Reason for Disposition  [1] MODERATE difficulty breathing (e.g., speaks in phrases, SOB even at rest, pulse 100-120) AND [2] NEW-onset or WORSE than normal  Answer Assessment - Initial Assessment Questions 1. RESPIRATORY STATUS: Describe your breathing? (e.g., wheezing, shortness of breath, unable to speak, severe coughing)      Shortness of breath  2. ONSET: When did this breathing problem begin?      Intermittent, On and Off  3. PATTERN Does the difficult breathing come and go, or has it been constant since it started?      Intermittent  4. SEVERITY: How bad is your breathing? (e.g., mild, moderate, severe)      Moderate  5. RECURRENT SYMPTOM: Have you had difficulty breathing before? If Yes, ask: When was the last time? and What happened that time?      No  6. CARDIAC HISTORY: Do you have any history of heart disease? (e.g., heart attack, angina, bypass surgery, angioplasty)      Extensive Family History  7. LUNG HISTORY: Do you have any history of lung disease?  (e.g., pulmonary embolus, asthma, emphysema)     Asthma  8. CAUSE: What do you think is causing the  breathing problem?      Unsure  9. OTHER SYMPTOMS: Do you have any other symptoms? (e.g., chest pain, cough, dizziness, fever, runny nose)     Chest Pain (Center)  10. O2 SATURATION MONITOR:  Do you use an oxygen saturation monitor (pulse oximeter) at home? If Yes, ask: What is your reading (oxygen level) today? What is your usual oxygen saturation reading? (e.g., 95%)       Unsure  11. PREGNANCY: Is there any chance you are pregnant? When was your last menstrual period?       No, LMP Unsure  12. TRAVEL: Have you traveled out of the country in the last month? (e.g., travel history, exposures)       No  Protocols used: Breathing Difficulty-A-AH

## 2024-03-24 NOTE — Patient Instructions (Addendum)
 Breathing Daily controller medication(s): continue Symbicort  160mcg 2 puffs twice a day with spacer and rinse mouth afterwards. Stop Spiriva 2 puffs once a day as ineffective. If you notice issues then okay to restart.  May use albuterol  rescue inhaler 2 puffs every 4 to 6 hours as needed for shortness of breath, chest tightness, coughing, and wheezing. May use albuterol  rescue inhaler 2 puffs 5 to 15 minutes prior to strenuous physical activities. Monitor frequency of use - if you need to use it more than twice per week on a consistent basis let us  know.  Breathing control goals:  Full participation in all desired activities (may need albuterol  before activity) Albuterol  use two times or less a week on average (not counting use with activity) Cough interfering with sleep two times or less a month Oral steroids no more than once a year No hospitalizations   Environmental allergies Recommend to start the injections once your neurology work up is completed AND your breathing is under better control as allergy  injections have a risk to worsen asthma symptoms if your asthma is not controlled.  Continue allergen avoidance measures directed toward grass pollen, weed pollen, tree pollen, indoor mold, and outdoor molds Take Allegra  (fexofenadine ) 180mg  in the morning Take Xyzal  (levocetirizine) 5mg  daily in the evening.   Nasal saline spray (i.e., Simply Saline) or nasal saline lavage (i.e., NeilMed) is recommended as needed and prior to medicated nasal sprays. Use cromolyn  4% 1 drop in each eye up to four times a day as needed for itchy/watery eyes.   Food Continue to avoid bananas in all form for now. For mild symptoms you can take over the counter antihistamines (zyrtec  10mg  to 20mg ) and monitor symptoms closely.  If symptoms worsen or if you have severe symptoms including breathing issues, throat closure, significant swelling, whole body hives, severe diarrhea and vomiting, lightheadedness then  use epinephrine  and seek immediate medical care afterwards. Emergency action plan given.  Please follow up with your psychiatrist regarding your anxiety. Please follow up with your PCP - I don't think all your shortness of breath/chest titghtness is due to your asthma.   Follow up in 1 month for regular check up and to start allergy  shots if doing well.

## 2024-03-29 ENCOUNTER — Other Ambulatory Visit: Payer: MEDICAID | Admitting: *Deleted

## 2024-03-29 ENCOUNTER — Ambulatory Visit: Payer: MEDICAID | Admitting: Podiatry

## 2024-03-30 ENCOUNTER — Ambulatory Visit: Payer: MEDICAID | Admitting: Podiatry

## 2024-04-01 ENCOUNTER — Ambulatory Visit: Payer: MEDICAID

## 2024-04-04 ENCOUNTER — Ambulatory Visit: Payer: MEDICAID | Admitting: Podiatry

## 2024-04-07 ENCOUNTER — Other Ambulatory Visit: Payer: MEDICAID | Admitting: *Deleted

## 2024-04-14 ENCOUNTER — Telehealth: Payer: Self-pay | Admitting: Neurology

## 2024-04-14 NOTE — Telephone Encounter (Signed)
 Pt called needing to speak to the nurse regarding her Migraine medication. Please advise.

## 2024-04-20 ENCOUNTER — Ambulatory Visit: Payer: MEDICAID | Admitting: Allergy

## 2024-04-26 ENCOUNTER — Ambulatory Visit: Payer: MEDICAID | Admitting: Allergy

## 2024-04-26 ENCOUNTER — Telehealth: Payer: Self-pay | Admitting: Neurology

## 2024-04-26 ENCOUNTER — Other Ambulatory Visit: Payer: MEDICAID | Admitting: *Deleted

## 2024-04-26 NOTE — Telephone Encounter (Signed)
 Pt called to request  a referral  for neurologist closer to home.Pt is moving to  [facility] , and wanted to see if MD can refer to Neurologist out there

## 2024-04-27 NOTE — Telephone Encounter (Signed)
 She can get referral from PCP. Thanks

## 2024-04-28 ENCOUNTER — Other Ambulatory Visit: Payer: MEDICAID | Admitting: *Deleted

## 2024-04-28 NOTE — Telephone Encounter (Signed)
 Pt called with request to r/s her EEG

## 2024-04-30 ENCOUNTER — Telehealth: Payer: MEDICAID | Admitting: Physician Assistant

## 2024-04-30 DIAGNOSIS — N760 Acute vaginitis: Secondary | ICD-10-CM

## 2024-04-30 DIAGNOSIS — B9689 Other specified bacterial agents as the cause of diseases classified elsewhere: Secondary | ICD-10-CM

## 2024-04-30 DIAGNOSIS — N3000 Acute cystitis without hematuria: Secondary | ICD-10-CM

## 2024-04-30 MED ORDER — METRONIDAZOLE 0.75 % VA GEL: 1.0000 | Freq: Every day | VAGINAL | 0 refills | Status: AC

## 2024-04-30 MED ORDER — SULFAMETHOXAZOLE-TRIMETHOPRIM 200-40 MG/5ML PO SUSP: 20.0000 mL | Freq: Two times a day (BID) | ORAL | 0 refills | Status: AC

## 2024-04-30 NOTE — Patient Instructions (Signed)
  Pamela Norris, thank you for joining [name], PA-C for today's virtual visit.  While this provider is not your primary care provider (PCP), if your PCP is located in our provider database this encounter information will be shared with them immediately following your visit.   A [facility] MyChart account gives you access to today's visit and all your visits, tests, and labs performed at [facility]  click here if you don't have a [facility] MyChart account or go to [email or web address]  Consent: (Patient) Pamela Norris provided verbal consent for this virtual visit at the beginning of the encounter.  Current Medications:  Current Outpatient Medications:    albuterol  (VENTOLIN  HFA) 108 (90 Base) MCG/ACT inhaler, Inhale 1 puff into the lungs every 6 (six) hours as needed for wheezing or shortness of breath., Disp: 8 g, Rfl: 1   atomoxetine  (STRATTERA ) 60 MG capsule, Take 40 mg by mouth daily., Disp: , Rfl:    budesonide -formoterol  (SYMBICORT ) 160-4.5 MCG/ACT inhaler, Inhale 2 puffs into the lungs 2 (two) times daily., Disp: 1 each, Rfl: 5   fexofenadine  (ALLEGRA  ALLERGY ) 180 MG tablet, Take 1 tablet (180 mg total) by mouth in the morning. For allergies, Disp: 30 tablet, Rfl: 3   ibuprofen  (ADVIL ) 800 MG tablet, Take by mouth., Disp: , Rfl:    levocetirizine (XYZAL ) 5 MG tablet, Take 1 tablet (5 mg total) by mouth every evening., Disp: 30 tablet, Rfl: 5   medroxyPROGESTERone  (DEPO-PROVERA ) 150 MG/ML injection, Inject 1 mL (150 mg total) into the muscle every 3 (three) months., Disp: 1 mL, Rfl: 3   polyethylene glycol powder (GLYCOLAX /MIRALAX ) 17 GM/SCOOP powder, Take 17 g by mouth daily., Disp: 3350 g, Rfl: 1   SUMAtriptan  (IMITREX ) 50 MG tablet, Take 1 tablet (50 mg total) by mouth every 2 (two) hours as needed for migraine. May repeat in 2 hours if headache persists or recurs., Disp: 10 tablet, Rfl: 0   topiramate (TOPAMAX) 25 MG tablet, Take 25 mg by mouth  daily., Disp: , Rfl:    Medications ordered in this encounter:  No orders of the defined types were placed in this encounter.    *If you need refills on other medications prior to your next appointment, please contact your pharmacy*  Follow-Up: Call back or seek an in-person evaluation if the symptoms worsen or if the condition fails to improve as anticipated.  [facility] [phone]  Other Instructions   If you have been instructed to have an in-person evaluation today at a local Urgent Care facility, please use the link below. It will take you to a list of all of our available [facility], including address, phone number and hours of operation. Please do not delay care.  [facility]  If you or a family member do not have a primary care provider, use the link below to schedule a visit and establish care. When you choose a [facility] primary care physician or advanced practice provider, you gain a long-term partner in health. Find a Primary Care Provider  Learn more about 's in-office and virtual care options:  - Get Care Now

## 2024-04-30 NOTE — Progress Notes (Signed)
 Virtual Visit Consent   Pamela Norris, you are scheduled for a virtual visit with a [facility] provider today. Just as with appointments in the office, your consent must be obtained to participate. Your consent will be active for this visit and any virtual visit you may have with one of our providers in the next 365 days. If you have a MyChart account, a copy of this consent can be sent to you electronically.  As this is a virtual visit, video technology does not allow for your provider to perform a traditional examination. This may limit your provider's ability to fully assess your condition. If your provider identifies any concerns that need to be evaluated in person or the need to arrange testing (such as labs, EKG, etc.), we will make arrangements to do so. Although advances in technology are sophisticated, we cannot ensure that it will always work on either your end or our end. If the connection with a video visit is poor, the visit may have to be switched to a telephone visit. With either a video or telephone visit, we are not always able to ensure that we have a secure connection.  By engaging in this virtual visit, you consent to the provision of healthcare and authorize for your insurance to be billed (if applicable) for the services provided during this visit. Depending on your insurance coverage, you may receive a charge related to this service.  I need to obtain your verbal consent now. Are you willing to proceed with your visit today? Pamela Norris has provided verbal consent on 04/30/2024 for a virtual visit (video or telephone). Pamela Norris, [place]  Date: 04/30/2024 3:27 PM   Virtual Visit via Video Note   I, Pamela Norris, connected with  VALEDA CORZINE  ([id number], 13-Nov-2000) on 04/30/24 at  3:15 PM EDT by a video-enabled telemedicine application and verified that I am speaking with the correct person using two identifiers.  Location: Patient: Virtual Visit  Location Patient: Home Provider: Virtual Visit Location Provider: Home Office   I discussed the limitations of evaluation and management by telemedicine and the availability of in person appointments. The patient expressed understanding and agreed to proceed.    History of Present Illness: Pamela Norris is a 23 y.o. who identifies as a female who was assigned female at birth, and is being seen today for need for medication changes. Patient notes being seen at ER in [facility] [place] yesterday for urinary symptoms. Was diagnosed with UTI and BV and started on course of Flagy and Bactrim . Notes there was some issue because she has a hard time with pills and asked for non pill medications. Having hard time getting back in touch with the ER she was seen at and wanted to reach out to see what her options are.   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   Adjustment disorder with mixed anxiety and depressed mood 10/06/2021   Bilateral temporomandibular joint pain 09/15/2020   Conduct disorder 07/13/2019   Oppositional defiant disorder    Disruptive mood  dysregulation disorder (HCC) 11/08/2015   Attention deficit hyperactivity disorder (ADHD), combined type 03/21/2013    Allergies:  Allergies  Allergen Reactions   Banana Itching   Trazodone  And Nefazodone Itching   Medications:  Current Outpatient Medications:    metroNIDAZOLE  (METROGEL ) 0.75 % vaginal gel, Place 1 Applicatorful vaginally at bedtime., Disp: 70 g, Rfl: 0    sulfamethoxazole -trimethoprim  (BACTRIM ) 200-40 MG/5ML suspension, Take 20 mLs by mouth 2 (two) times daily for 5 days., Disp: 200 mL, Rfl: 0   albuterol  (VENTOLIN  HFA) 108 (90 Base) MCG/ACT inhaler, Inhale 1 puff into the lungs every 6 (six) hours as needed for wheezing or shortness of breath., Disp: 8 g, Rfl: 1   atomoxetine  (STRATTERA ) 60 MG capsule, Take 40 mg by mouth daily., Disp: , Rfl:    budesonide -formoterol  (SYMBICORT ) 160-4.5 MCG/ACT inhaler, Inhale 2 puffs into the lungs 2 (two) times daily., Disp: 1 each, Rfl: 5   fexofenadine  (ALLEGRA  ALLERGY ) 180 MG tablet, Take 1 tablet (180 mg total) by mouth in the morning. For allergies, Disp: 30 tablet, Rfl: 3   ibuprofen  (ADVIL ) 800 MG tablet, Take by mouth., Disp: , Rfl:    levocetirizine (XYZAL ) 5 MG tablet, Take 1 tablet (5 mg total) by mouth every evening., Disp: 30 tablet, Rfl: 5   medroxyPROGESTERone  (DEPO-PROVERA ) 150 MG/ML injection, Inject 1 mL (150 mg total) into the muscle every 3 (three) months., Disp: 1 mL, Rfl: 3   polyethylene glycol powder (GLYCOLAX /MIRALAX ) 17 GM/SCOOP powder, Take 17 g by mouth daily., Disp: 3350 g, Rfl: 1   SUMAtriptan  (IMITREX ) 50 MG tablet, Take 1 tablet (50 mg total) by mouth every 2 (two) hours as needed for migraine. May repeat in 2 hours if headache persists or recurs., Disp: 10 tablet, Rfl: 0   topiramate (TOPAMAX) 25 MG tablet, Take 25 mg by mouth daily., Disp: , Rfl:   Observations/Objective: Patient is well-developed, well-nourished in no acute distress.  Resting comfortably  at home.  Head is normocephalic, atraumatic.  No labored breathing.  Speech is clear and coherent with logical content.  Patient is alert and oriented at baseline.   Assessment and Plan: 1. Acute cystitis without hematuria (Primary) - sulfamethoxazole -trimethoprim  (BACTRIM ) 200-40 MG/5ML suspension; Take 20 mLs by mouth 2 (two) times daily for 5 days.  Dispense: 200 mL; Refill: 0  2. BV (bacterial vaginosis) -  metroNIDAZOLE  (METROGEL ) 0.75 % vaginal gel; Place 1 Applicatorful vaginally at bedtime.  Dispense: 70 g; Refill: 0  Will switch Bactrim  to liquid formulation and Flagyl  to vaginal cream so she can take her medications as directed by EDP. Follow-up with PCP for any non-resolving, new or worsening symptoms despite treatment.   Follow Up Instructions: I discussed the assessment and treatment plan with the patient. The patient was provided an opportunity to ask questions and all were answered. The patient agreed with the plan and demonstrated an understanding of the instructions.  A copy of instructions were sent to the patient via MyChart unless otherwise noted below.    The patient was advised to call back or seek an in-person evaluation if the symptoms worsen or if the condition fails to improve as anticipated.    Pamela Velma Lunger, PA-C

## 2024-05-09 ENCOUNTER — Other Ambulatory Visit: Payer: MEDICAID | Admitting: *Deleted

## 2024-05-09 ENCOUNTER — Telehealth: Payer: Self-pay | Admitting: Neurology

## 2024-05-09 NOTE — Telephone Encounter (Signed)
 appointment r/s due to conflict

## 2024-05-09 NOTE — Progress Notes (Deleted)
 Follow Up Note  RE: Pamela Norris MRN: [id number] DOB: 2001/04/15 Date of Office Visit: 05/10/2024  Referring provider: Joesph Annabella CHRISTELLA, FNP Primary care provider: Joesph Annabella CHRISTELLA, FNP  Chief Complaint: No chief complaint on file.  History of Present Illness: I had the pleasure of seeing Pamela Norris for a follow up visit at the [facility] on 05/10/2024. She is a 23 y.o. female, who is being followed for asthma, allergic rhinoconjunctivitis, food allergy . Her previous allergy  office visit was on 03/24/2024 with Dr. Luke. Today is a regular follow up visit.  Discussed the use of AI scribe software for clinical note transcription with the patient, who gave verbal consent to proceed.  History of Present Illness            ***  Assessment and Plan: Pamela Norris is a 23 y.o. female with: Chest tightness Shortness of breath Anxiety Palpitations  Moderate persistent asthma without complication Not well controlled. No improvement with addition of Spiriva. Uses albuterol  prn at times with no benefit. Patient has significant anxiety history and follows with psychiatry. Today's spirometry consistent with possible restrictive disease with no improvement in FEV1 post bronchodilator treatment. Clinically feeling unchanged.  Discussed with patient at length that not all shortness of breath, chest tightness are due to asthma. I do NOT believe that her current symptoms are due to uncontrolled asthma and more concerning if it's due to her anxiety or other underlying conditions that is beyond my realm of specialty. I urged patient to follow up with her psychiatrist for better management of her anxiety. She also needs to follow up with her PCP as she is complaining of palpitations which can also worsen her breathing symptoms.  Daily controller medication(s): continue Symbicort  160mcg 2 puffs twice a day with spacer and rinse mouth afterwards. Stop Spiriva 2 puffs once a day  as ineffective. If you notice issues then okay to restart.  May use albuterol  rescue inhaler 2 puffs every 4 to 6 hours as needed for shortness of breath, chest tightness, coughing, and wheezing. May use albuterol  rescue inhaler 2 puffs 5 to 15 minutes prior to strenuous physical activities. Monitor frequency of use - if you need to use it more than twice per week on a consistent basis let us  know.    Seasonal and perennial allergic rhinoconjunctivitis Symptoms partially relieved with current treatment. Nasal spray not used due to adverse effects.  Discussed at length with patient that she needs to complete her neurology work up and get her underlying trigger for her breathing under better control before starting AIT.  I gave her handout on AIT again to read over regarding risks and benefits as I'm not convinced she understood how allergy  injections work and what are the risks associated with it even with verbalizing it during today's visit. Continue allergen avoidance measures directed toward grass pollen, weed pollen, tree pollen, indoor mold, and outdoor molds Take Allegra  (fexofenadine ) 180mg  in the morning Take Xyzal  (levocetirizine) 5mg  daily in the evening.  Nasal saline spray (i.e., Simply Saline) or nasal saline lavage (i.e., NeilMed) is recommended as needed and prior to medicated nasal sprays. Use cromolyn  4% 1 drop in each eye up to four times a day as needed for itchy/watery eyes.    Food allergy  Positive skin testing in the past but has inconsistent clinical symptoms.  Continue to avoid bananas in all form for now. For mild symptoms you can take over the counter antihistamines (zyrtec  10mg  to 20mg )  and monitor symptoms closely.  If symptoms worsen or if you have severe symptoms including breathing issues, throat closure, significant swelling, whole body hives, severe diarrhea and vomiting, lightheadedness then use epinephrine  and seek immediate medical care afterwards. Emergency  action plan given. Assessment and Plan              No follow-ups on file.  No orders of the defined types were placed in this encounter.  Lab Orders  No laboratory test(s) ordered today    Diagnostics: Spirometry:  Tracings reviewed. Her effort: {Blank single:19197::Good reproducible efforts.,It was hard to get consistent efforts and there is a question as to whether this reflects a maximal maneuver.,Poor effort, data can not be interpreted.} FVC: ***L FEV1: ***L, ***% predicted FEV1/FVC ratio: ***% Interpretation: {Blank single:19197::Spirometry consistent with mild obstructive disease,Spirometry consistent with moderate obstructive disease,Spirometry consistent with severe obstructive disease,Spirometry consistent with possible restrictive disease,Spirometry consistent with mixed obstructive and restrictive disease,Spirometry uninterpretable due to technique,Spirometry consistent with normal pattern,No overt abnormalities noted given today's efforts}.  Please see scanned spirometry results for details.  Skin Testing: {Blank single:19197::Select foods,Environmental allergy  panel,Environmental allergy  panel and select foods,Food allergy  panel,None,Deferred due to recent antihistamines use}. *** Results discussed with patient/family.   Medication List:  Current Outpatient Medications  Medication Sig Dispense Refill   albuterol  (VENTOLIN  HFA) 108 (90 Base) MCG/ACT inhaler Inhale 1 puff into the lungs every 6 (six) hours as needed for wheezing or shortness of breath. 8 g 1   atomoxetine  (STRATTERA ) 60 MG capsule Take 40 mg by mouth daily.     budesonide -formoterol  (SYMBICORT ) 160-4.5 MCG/ACT inhaler Inhale 2 puffs into the lungs 2 (two) times daily. 1 each 5   fexofenadine  (ALLEGRA  ALLERGY ) 180 MG tablet Take 1 tablet (180 mg total) by mouth in the morning. For allergies 30 tablet 3   ibuprofen  (ADVIL ) 800 MG tablet Take by mouth.      levocetirizine (XYZAL ) 5 MG tablet Take 1 tablet (5 mg total) by mouth every evening. 30 tablet 5   medroxyPROGESTERone  (DEPO-PROVERA ) 150 MG/ML injection Inject 1 mL (150 mg total) into the muscle every 3 (three) months. 1 mL 3   metroNIDAZOLE  (METROGEL ) 0.75 % vaginal gel Place 1 Applicatorful vaginally at bedtime. 70 g 0   polyethylene glycol powder (GLYCOLAX /MIRALAX ) 17 GM/SCOOP powder Take 17 g by mouth daily. 3350 g 1   SUMAtriptan  (IMITREX ) 50 MG tablet Take 1 tablet (50 mg total) by mouth every 2 (two) hours as needed for migraine. May repeat in 2 hours if headache persists or recurs. 10 tablet 0   topiramate (TOPAMAX) 25 MG tablet Take 25 mg by mouth daily.     No current facility-administered medications for this visit.   Allergies: Allergies  Allergen Reactions   Banana Itching   Trazodone  And Nefazodone Itching   I reviewed her past medical history, social history, family history, and environmental history and no significant changes have been reported from her previous visit.  Review of Systems  Constitutional:  Negative for appetite change, chills, fever and unexpected weight change.  HENT:  Positive for congestion. Negative for rhinorrhea.   Eyes:  Negative for itching.  Respiratory:  Positive for chest tightness and shortness of breath. Negative for cough and wheezing.   Cardiovascular:  Negative for chest pain.  Gastrointestinal:  Negative for abdominal pain.  Genitourinary:  Negative for difficulty urinating.  Skin:  Negative for rash.  Allergic/Immunologic: Positive for environmental allergies and food allergies.  Neurological:  Negative for headaches.  Objective: There were no vitals taken for this visit. There is no height or weight on file to calculate BMI. Physical Exam Vitals and nursing note reviewed.  Constitutional:      Appearance: She is well-developed.  HENT:     Head: Normocephalic and atraumatic.     Right Ear: Tympanic membrane and external ear  normal.     Left Ear: Tympanic membrane and external ear normal.     Nose: Nose normal.     Mouth/Throat:     Mouth: Mucous membranes are moist.     Pharynx: Oropharynx is clear.  Eyes:     Conjunctiva/sclera: Conjunctivae normal.  Cardiovascular:     Rate and Rhythm: Normal rate and regular rhythm.     Heart sounds: Normal heart sounds. No murmur heard.    No friction rub. No gallop.  Pulmonary:     Effort: Pulmonary effort is normal.     Breath sounds: Normal breath sounds. No wheezing, rhonchi or rales.  Musculoskeletal:     Cervical back: Neck supple.  Skin:    General: Skin is warm.     Findings: No rash.  Neurological:     Mental Status: She is alert and oriented to person, place, and time.    Previous notes and tests were reviewed. The plan was reviewed with the patient/family, and all questions/concerned were addressed.  It was my pleasure to see Pamela Norris today and participate in her care. Please feel free to contact me with any questions or concerns.  Sincerely,  Orlan Cramp, DO Allergy  & Immunology  [facility]  [place] office: [phone] [facility] office: [phone]

## 2024-05-10 ENCOUNTER — Ambulatory Visit: Payer: MEDICAID | Admitting: Allergy

## 2024-05-10 DIAGNOSIS — H1010 Acute atopic conjunctivitis, unspecified eye: Secondary | ICD-10-CM

## 2024-05-10 DIAGNOSIS — T781XXD Other adverse food reactions, not elsewhere classified, subsequent encounter: Secondary | ICD-10-CM

## 2024-05-17 ENCOUNTER — Ambulatory Visit (INDEPENDENT_AMBULATORY_CARE_PROVIDER_SITE_OTHER): Payer: MEDICAID | Admitting: Neurology

## 2024-05-17 ENCOUNTER — Ambulatory Visit: Payer: Self-pay | Admitting: Neurology

## 2024-05-17 ENCOUNTER — Other Ambulatory Visit: Payer: Self-pay | Admitting: Neurology

## 2024-05-17 DIAGNOSIS — R569 Unspecified convulsions: Secondary | ICD-10-CM | POA: Diagnosis not present

## 2024-05-17 MED ORDER — SUMATRIPTAN SUCCINATE 50 MG PO TABS: 50.0000 mg | ORAL_TABLET | ORAL | 3 refills | Status: AC | PRN

## 2024-05-17 NOTE — Addendum Note (Signed)
 Addended by: ONEITA HOIST E on: 05/17/2024 12:11 PM   Modules accepted: Orders

## 2024-05-17 NOTE — Telephone Encounter (Signed)
 Pt sent called 04/14/24 regarding refill for migraine meds you responded via [facility] but pt does not use [facility] please call her. She doesn't know the name of the meds but she is completely out.

## 2024-05-17 NOTE — Telephone Encounter (Signed)
  Returned call to pt regarding the above messaging. Pt was in office for an eeg.  Pt stated that she needs refill and stated she doesn't want to be referred to a new neurologist that she previously stated and that she would like to continue to be seen here with Dr. Gregg. She then proceeded to ask for a sleep referral I stated that would have to come thru pcp and then she replied well I am not coming to [facility] for anything else just neurology. And told her to reach out to pcp for sleep study first. Sending this to Dr. Gregg to see if he is willing to send imitrex  as the pt has been questionable about stating on as his pt.

## 2024-05-17 NOTE — Procedures (Signed)
   History:  23 year old woman with seizure like activity   EEG classification:  Awake and asleep  Duration: 26 minutes   Technical aspects: This EEG study was done with scalp electrodes positioned according to the 10-20 International system of electrode placement. Electrical activity was reviewed with band pass filter of 1-70Hz , sensitivity of 7 uV/mm, display speed of 39mm/sec with a 60Hz  notched filter applied as appropriate. EEG data were recorded continuously and digitally stored.   Description of the recording: The background rhythms of this recording consists of a fairly well modulated medium amplitude background beta activity. As the record progresses, the patient initially is in the waking state, but appears to enter the early stage II sleep during the recording, with rudimentary sleep spindles and vertex sharp wave activity seen. During the wakeful state, photic stimulation was performed, and no abnormal responses were seen. Hyperventilation was also performed, no abnormal response seen. No epileptiform discharges seen during this recording. There was no focal slowing.   Abnormality: Excess beta activity   Impression: This is a normal awake and sleep EEG. No evidence of interictal epileptiform discharges.  The excess beta activity is a benign process and likely a side effect of medication. Normal EEGs, however, do not rule out epilepsy.    Deaundra Dupriest, MD [facility]

## 2024-05-19 ENCOUNTER — Ambulatory Visit: Payer: MEDICAID | Admitting: Allergy

## 2024-05-19 NOTE — Progress Notes (Deleted)
 Follow Up Note  RE: Pamela Norris MRN: [id number] DOB: October 14, 2000 Date of Office Visit: 05/19/2024  Referring provider: Joesph Annabella CHRISTELLA, FNP Primary care provider: Joesph Annabella CHRISTELLA, FNP  Chief Complaint: No chief complaint on file.  History of Present Illness: I had the pleasure of seeing Pamela Norris for a follow up visit at the [facility] on 05/19/2024. She is a 23 y.o. female, who is being followed for asthma, allergic rhinoconjunctivitis, food allergy . Her previous allergy  office visit was on 03/24/2024 with Dr. Luke. Today is a regular follow up visit.  Discussed the use of AI scribe software for clinical note transcription with the patient, who gave verbal consent to proceed.  History of Present Illness            ***  Assessment and Plan: Pamela Norris is a 23 y.o. female with: Chest tightness Shortness of breath Anxiety Palpitations  Moderate persistent asthma without complication Not well controlled. No improvement with addition of Spiriva. Uses albuterol  prn at times with no benefit. Patient has significant anxiety history and follows with psychiatry. Today's spirometry consistent with possible restrictive disease with no improvement in FEV1 post bronchodilator treatment. Clinically feeling unchanged.  Discussed with patient at length that not all shortness of breath, chest tightness are due to asthma. I do NOT believe that her current symptoms are due to uncontrolled asthma and more concerning if it's due to her anxiety or other underlying conditions that is beyond my realm of specialty. I urged patient to follow up with her psychiatrist for better management of her anxiety. She also needs to follow up with her PCP as she is complaining of palpitations which can also worsen her breathing symptoms.  Daily controller medication(s): continue Symbicort  160mcg 2 puffs twice a day with spacer and rinse mouth afterwards. Stop Spiriva 2 puffs once a day  as ineffective. If you notice issues then okay to restart.  May use albuterol  rescue inhaler 2 puffs every 4 to 6 hours as needed for shortness of breath, chest tightness, coughing, and wheezing. May use albuterol  rescue inhaler 2 puffs 5 to 15 minutes prior to strenuous physical activities. Monitor frequency of use - if you need to use it more than twice per week on a consistent basis let us  know.    Seasonal and perennial allergic rhinoconjunctivitis Symptoms partially relieved with current treatment. Nasal spray not used due to adverse effects.  Discussed at length with patient that she needs to complete her neurology work up and get her underlying trigger for her breathing under better control before starting AIT.  I gave her handout on AIT again to read over regarding risks and benefits as I'm not convinced she understood how allergy  injections work and what are the risks associated with it even with verbalizing it during today's visit. Continue allergen avoidance measures directed toward grass pollen, weed pollen, tree pollen, indoor mold, and outdoor molds Take Allegra  (fexofenadine ) 180mg  in the morning Take Xyzal  (levocetirizine) 5mg  daily in the evening.  Nasal saline spray (i.e., Simply Saline) or nasal saline lavage (i.e., NeilMed) is recommended as needed and prior to medicated nasal sprays. Use cromolyn  4% 1 drop in each eye up to four times a day as needed for itchy/watery eyes.    Food allergy  Positive skin testing in the past but has inconsistent clinical symptoms.  Continue to avoid bananas in all form for now. For mild symptoms you can take over the counter antihistamines (zyrtec  10mg  to 20mg )  and monitor symptoms closely.  If symptoms worsen or if you have severe symptoms including breathing issues, throat closure, significant swelling, whole body hives, severe diarrhea and vomiting, lightheadedness then use epinephrine  and seek immediate medical care afterwards. Emergency  action plan given. Assessment and Plan              No follow-ups on file.  No orders of the defined types were placed in this encounter.  Lab Orders  No laboratory test(s) ordered today    Diagnostics: Spirometry:  Tracings reviewed. Her effort: {Blank single:19197::Good reproducible efforts.,It was hard to get consistent efforts and there is a question as to whether this reflects a maximal maneuver.,Poor effort, data can not be interpreted.} FVC: ***L FEV1: ***L, ***% predicted FEV1/FVC ratio: ***% Interpretation: {Blank single:19197::Spirometry consistent with mild obstructive disease,Spirometry consistent with moderate obstructive disease,Spirometry consistent with severe obstructive disease,Spirometry consistent with possible restrictive disease,Spirometry consistent with mixed obstructive and restrictive disease,Spirometry uninterpretable due to technique,Spirometry consistent with normal pattern,No overt abnormalities noted given today's efforts}.  Please see scanned spirometry results for details.  Skin Testing: {Blank single:19197::Select foods,Environmental allergy  panel,Environmental allergy  panel and select foods,Food allergy  panel,None,Deferred due to recent antihistamines use}. *** Results discussed with patient/family.   Medication List:  Current Outpatient Medications  Medication Sig Dispense Refill   albuterol  (VENTOLIN  HFA) 108 (90 Base) MCG/ACT inhaler Inhale 1 puff into the lungs every 6 (six) hours as needed for wheezing or shortness of breath. 8 g 1   atomoxetine  (STRATTERA ) 60 MG capsule Take 40 mg by mouth daily.     budesonide -formoterol  (SYMBICORT ) 160-4.5 MCG/ACT inhaler Inhale 2 puffs into the lungs 2 (two) times daily. 1 each 5   fexofenadine  (ALLEGRA  ALLERGY ) 180 MG tablet Take 1 tablet (180 mg total) by mouth in the morning. For allergies 30 tablet 3   ibuprofen  (ADVIL ) 800 MG tablet Take by mouth.      levocetirizine (XYZAL ) 5 MG tablet Take 1 tablet (5 mg total) by mouth every evening. 30 tablet 5   medroxyPROGESTERone  (DEPO-PROVERA ) 150 MG/ML injection Inject 1 mL (150 mg total) into the muscle every 3 (three) months. 1 mL 3   metroNIDAZOLE  (METROGEL ) 0.75 % vaginal gel Place 1 Applicatorful vaginally at bedtime. 70 g 0   polyethylene glycol powder (GLYCOLAX /MIRALAX ) 17 GM/SCOOP powder Take 17 g by mouth daily. 3350 g 1   SUMAtriptan  (IMITREX ) 50 MG tablet Take 1 tablet (50 mg total) by mouth every 2 (two) hours as needed for migraine. May repeat in 2 hours if headache persists or recurs. 10 tablet 3   topiramate (TOPAMAX) 25 MG tablet Take 25 mg by mouth daily.     No current facility-administered medications for this visit.   Allergies: Allergies  Allergen Reactions   Banana Itching   Trazodone  And Nefazodone Itching   I reviewed her past medical history, social history, family history, and environmental history and no significant changes have been reported from her previous visit.  Review of Systems  Constitutional:  Negative for appetite change, chills, fever and unexpected weight change.  HENT:  Positive for congestion. Negative for rhinorrhea.   Eyes:  Negative for itching.  Respiratory:  Positive for chest tightness and shortness of breath. Negative for cough and wheezing.   Cardiovascular:  Negative for chest pain.  Gastrointestinal:  Negative for abdominal pain.  Genitourinary:  Negative for difficulty urinating.  Skin:  Negative for rash.  Allergic/Immunologic: Positive for environmental allergies and food allergies.  Neurological:  Negative for headaches.  Objective: There were no vitals taken for this visit. There is no height or weight on file to calculate BMI. Physical Exam Vitals and nursing note reviewed.  Constitutional:      Appearance: She is well-developed.  HENT:     Head: Normocephalic and atraumatic.     Right Ear: Tympanic membrane and external ear  normal.     Left Ear: Tympanic membrane and external ear normal.     Nose: Nose normal.     Mouth/Throat:     Mouth: Mucous membranes are moist.     Pharynx: Oropharynx is clear.  Eyes:     Conjunctiva/sclera: Conjunctivae normal.  Cardiovascular:     Rate and Rhythm: Normal rate and regular rhythm.     Heart sounds: Normal heart sounds. No murmur heard.    No friction rub. No gallop.  Pulmonary:     Effort: Pulmonary effort is normal.     Breath sounds: Normal breath sounds. No wheezing, rhonchi or rales.  Musculoskeletal:     Cervical back: Neck supple.  Skin:    General: Skin is warm.     Findings: No rash.  Neurological:     Mental Status: She is alert and oriented to person, place, and time.    Previous notes and tests were reviewed. The plan was reviewed with the patient/family, and all questions/concerned were addressed.  It was my pleasure to see Pamela Norris today and participate in her care. Please feel free to contact me with any questions or concerns.  Sincerely,  Orlan Cramp, DO Allergy  & Immunology  [facility]  [place] office: [phone] [facility] office: [phone]

## 2024-06-10 ENCOUNTER — Telehealth: Payer: MEDICAID | Admitting: Family Medicine

## 2024-06-10 ENCOUNTER — Telehealth: Payer: MEDICAID

## 2024-06-10 DIAGNOSIS — B850 Pediculosis due to Pediculus humanus capitis: Secondary | ICD-10-CM

## 2024-06-10 DIAGNOSIS — L299 Pruritus, unspecified: Secondary | ICD-10-CM | POA: Diagnosis not present

## 2024-06-10 MED ORDER — IVERMECTIN 0.5 % EX LOTN: 1.0000 | TOPICAL_LOTION | Freq: Once | CUTANEOUS | 0 refills | Status: AC

## 2024-06-10 NOTE — Progress Notes (Signed)
 Virtual Visit Consent   Pamela Norris, you are scheduled for a virtual visit with a [facility] provider today. Just as with appointments in the office, your consent must be obtained to participate. Your consent will be active for this visit and any virtual visit you may have with one of our providers in the next 365 days. If you have a MyChart account, a copy of this consent can be sent to you electronically.  As this is a virtual visit, video technology does not allow for your provider to perform a traditional examination. This may limit your provider's ability to fully assess your condition. If your provider identifies any concerns that need to be evaluated in person or the need to arrange testing (such as labs, EKG, etc.), we will make arrangements to do so. Although advances in technology are sophisticated, we cannot ensure that it will always work on either your end or our end. If the connection with a video visit is poor, the visit may have to be switched to a telephone visit. With either a video or telephone visit, we are not always able to ensure that we have a secure connection.  By engaging in this virtual visit, you consent to the provision of healthcare and authorize for your insurance to be billed (if applicable) for the services provided during this visit. Depending on your insurance coverage, you may receive a charge related to this service.  I need to obtain your verbal consent now. Are you willing to proceed with your visit today? Pamela Norris has provided verbal consent on 06/10/2024 for a virtual visit (video or telephone). Loa Lamp, FNP  Date: 06/10/2024 11:15 AM   Virtual Visit via Video Note   I, Loa Lamp, connected with  Pamela Norris  ([id number], 2000/10/25) on 06/10/24 at 10:45 AM EDT by a video-enabled telemedicine application and verified that I am speaking with the correct person using two identifiers.  Location: Patient: Virtual Visit Location Patient:  Home Provider: Virtual Visit Location Provider: Home Office   I discussed the limitations of evaluation and management by telemedicine and the availability of in person appointments. The patient expressed understanding and agreed to proceed.    History of Present Illness: Pamela Norris is a 23 y.o. who identifies as a female who was assigned female at birth, and is being seen today for treatment for head lice. Her sister found multiple lice and nits in head last night. She is itching. Pamela  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   Adjustment disorder with mixed anxiety and depressed mood 10/06/2021   Bilateral temporomandibular joint pain 09/15/2020   Conduct disorder 07/13/2019   Oppositional defiant disorder    Disruptive mood dysregulation disorder 11/08/2015   Attention deficit hyperactivity disorder (ADHD), combined type 03/21/2013    Allergies:  Allergies  Allergen Reactions   Banana Itching   Trazodone  And Nefazodone Itching   Medications:  Current Outpatient Medications:    Ivermectin  0.5 % LOTN, Apply 1 Bottle topically once for 1 dose. Apply to hair and scalp and comb  through x 1- rinse out after 10 min, Disp: 1 g, Rfl: 0   albuterol  (VENTOLIN  HFA) 108 (90 Base) MCG/ACT inhaler, Inhale 1 puff into the lungs every 6 (six) hours as needed for wheezing or shortness of breath., Disp: 8 g, Rfl: 1   atomoxetine  (STRATTERA ) 60 MG capsule, Take 40 mg by mouth daily., Disp: , Rfl:    budesonide -formoterol  (SYMBICORT )  160-4.5 MCG/ACT inhaler, Inhale 2 puffs into the lungs 2 (two) times daily., Disp: 1 each, Rfl: 5   fexofenadine  (ALLEGRA  ALLERGY ) 180 MG tablet, Take 1 tablet (180 mg total) by mouth in the morning. For allergies, Disp: 30 tablet, Rfl: 3   ibuprofen  (ADVIL ) 800 MG tablet, Take by mouth., Disp: , Rfl:    levocetirizine (XYZAL ) 5 MG tablet, Take 1 tablet (5 mg total) by mouth every evening., Disp: 30 tablet, Rfl: 5   medroxyPROGESTERone  (DEPO-PROVERA ) 150 MG/ML injection, Inject 1 mL (150 mg total) into the muscle every 3 (three) months., Disp: 1 mL, Rfl: 3   metroNIDAZOLE  (METROGEL ) 0.75 % vaginal gel, Place 1 Applicatorful vaginally at bedtime., Disp: 70 g, Rfl: 0   polyethylene glycol powder (GLYCOLAX /MIRALAX ) 17 GM/SCOOP powder, Take 17 g by mouth daily., Disp: 3350 g, Rfl: 1   SUMAtriptan  (IMITREX ) 50 MG tablet, Take 1 tablet (50 mg total) by mouth every 2 (two) hours as needed for migraine. May repeat in 2 hours if headache persists or recurs., Disp: 10 tablet, Rfl: 3   topiramate (TOPAMAX) 25 MG tablet, Take 25 mg by mouth daily., Disp: , Rfl:   Observations/Objective: Patient is well-developed, well-nourished in no acute distress.  Resting comfortably  at home.  Head is normocephalic, atraumatic.  No labored breathing.  Speech is clear and coherent with logical content.  Patient is alert and oriented at baseline.   Assessment and Plan: 1. Head lice (Primary)  2. Itching  Information provided for cleaning etc to completely rid pt and home from lice, discussed nix and rid. May use benadryl  for itching.   Follow Up Instructions: I discussed the assessment and treatment plan with the patient. The patient was provided an opportunity to ask questions and all were answered. The patient agreed with the plan and demonstrated an understanding of the instructions.  A copy of instructions were sent to the patient via MyChart unless otherwise noted below.     The patient was advised to call  back or seek an in-person evaluation if the symptoms worsen or if the condition fails to improve as anticipated.    Domanic Matusek, FNP

## 2024-06-23 ENCOUNTER — Ambulatory Visit: Payer: MEDICAID | Admitting: Allergy

## 2024-06-23 ENCOUNTER — Telehealth: Payer: Self-pay

## 2024-06-23 ENCOUNTER — Other Ambulatory Visit (HOSPITAL_COMMUNITY): Payer: Self-pay

## 2024-06-23 NOTE — Telephone Encounter (Signed)
*  AA  Pharmacy Patient Advocate Encounter   Received notification from CoverMyMeds that prior authorization for Fexofenadine  HCl 180MG  tablets   is required/requested.   Insurance verification completed.   The patient is insured through [employer].   Per test claim: PA required; PA submitted to above mentioned insurance via Latent Key/confirmation #/EOC [id number] Status is pending

## 2024-06-23 NOTE — Progress Notes (Deleted)
 Follow Up Note  RE: Pamela Norris MRN: [id number] DOB: March 21, 2001 Date of Office Visit: 06/23/2024  Referring provider: Joesph Annabella CHRISTELLA, FNP Primary care provider: Joesph Annabella CHRISTELLA, FNP  Chief Complaint: No chief complaint on file.  History of Present Illness: I had the pleasure of seeing Pamela Norris for a follow up visit at the [facility] on 06/23/2024. She is a 23 y.o. female, who is being followed for asthma, allergic rhino conjunctivitis, food allergy . Her previous allergy  office visit was on 03/24/2024 with Dr. Luke. Today is a regular follow up visit.  Discussed the use of AI scribe software for clinical note transcription with the patient, who gave verbal consent to proceed.  History of Present Illness            ***  Assessment and Plan: Pamela Norris is a 23 y.o. female with: Chest tightness Shortness of breath Anxiety Palpitations  Moderate persistent asthma without complication Not well controlled. No improvement with addition of Spiriva. Uses albuterol  prn at times with no benefit. Patient has significant anxiety history and follows with psychiatry. Today's spirometry consistent with possible restrictive disease with no improvement in FEV1 post bronchodilator treatment. Clinically feeling unchanged.  Discussed with patient at length that not all shortness of breath, chest tightness are due to asthma. I do NOT believe that her current symptoms are due to uncontrolled asthma and more concerning if it's due to her anxiety or other underlying conditions that is beyond my realm of specialty. I urged patient to follow up with her psychiatrist for better management of her anxiety. She also needs to follow up with her PCP as she is complaining of palpitations which can also worsen her breathing symptoms.  Daily controller medication(s): continue Symbicort  160mcg 2 puffs twice a day with spacer and rinse mouth afterwards. Stop Spiriva 2 puffs once a day  as ineffective. If you notice issues then okay to restart.  May use albuterol  rescue inhaler 2 puffs every 4 to 6 hours as needed for shortness of breath, chest tightness, coughing, and wheezing. May use albuterol  rescue inhaler 2 puffs 5 to 15 minutes prior to strenuous physical activities. Monitor frequency of use - if you need to use it more than twice per week on a consistent basis let us  know.    Seasonal and perennial allergic rhinoconjunctivitis Symptoms partially relieved with current treatment. Nasal spray not used due to adverse effects.  Discussed at length with patient that she needs to complete her neurology work up and get her underlying trigger for her breathing under better control before starting AIT.  I gave her handout on AIT again to read over regarding risks and benefits as I'm not convinced she understood how allergy  injections work and what are the risks associated with it even with verbalizing it during today's visit. Continue allergen avoidance measures directed toward grass pollen, weed pollen, tree pollen, indoor mold, and outdoor molds Take Allegra  (fexofenadine ) 180mg  in the morning Take Xyzal  (levocetirizine) 5mg  daily in the evening.  Nasal saline spray (i.e., Simply Saline) or nasal saline lavage (i.e., NeilMed) is recommended as needed and prior to medicated nasal sprays. Use cromolyn  4% 1 drop in each eye up to four times a day as needed for itchy/watery eyes.    Food allergy  Positive skin testing in the past but has inconsistent clinical symptoms.  Continue to avoid bananas in all form for now. For mild symptoms you can take over the counter antihistamines (zyrtec  10mg  to  20mg ) and monitor symptoms closely.  If symptoms worsen or if you have severe symptoms including breathing issues, throat closure, significant swelling, whole body hives, severe diarrhea and vomiting, lightheadedness then use epinephrine  and seek immediate medical care afterwards. Emergency  action plan given. Assessment and Plan              No follow-ups on file.  No orders of the defined types were placed in this encounter.  Lab Orders  No laboratory test(s) ordered today    Diagnostics: Spirometry:  Tracings reviewed. Her effort: {Blank single:19197::Good reproducible efforts.,It was hard to get consistent efforts and there is a question as to whether this reflects a maximal maneuver.,Poor effort, data can not be interpreted.} FVC: ***L FEV1: ***L, ***% predicted FEV1/FVC ratio: ***% Interpretation: {Blank single:19197::Spirometry consistent with mild obstructive disease,Spirometry consistent with moderate obstructive disease,Spirometry consistent with severe obstructive disease,Spirometry consistent with possible restrictive disease,Spirometry consistent with mixed obstructive and restrictive disease,Spirometry uninterpretable due to technique,Spirometry consistent with normal pattern,No overt abnormalities noted given today's efforts}.  Please see scanned spirometry results for details.  Skin Testing: {Blank single:19197::Select foods,Environmental allergy  panel,Environmental allergy  panel and select foods,Food allergy  panel,None,Deferred due to recent antihistamines use}. *** Results discussed with patient/family.   Medication List:  Current Outpatient Medications  Medication Sig Dispense Refill   albuterol  (VENTOLIN  HFA) 108 (90 Base) MCG/ACT inhaler Inhale 1 puff into the lungs every 6 (six) hours as needed for wheezing or shortness of breath. 8 g 1   atomoxetine  (STRATTERA ) 60 MG capsule Take 40 mg by mouth daily.     budesonide -formoterol  (SYMBICORT ) 160-4.5 MCG/ACT inhaler Inhale 2 puffs into the lungs 2 (two) times daily. 1 each 5   fexofenadine  (ALLEGRA  ALLERGY ) 180 MG tablet Take 1 tablet (180 mg total) by mouth in the morning. For allergies 30 tablet 3   ibuprofen  (ADVIL ) 800 MG tablet Take by mouth.      levocetirizine (XYZAL ) 5 MG tablet Take 1 tablet (5 mg total) by mouth every evening. 30 tablet 5   medroxyPROGESTERone  (DEPO-PROVERA ) 150 MG/ML injection Inject 1 mL (150 mg total) into the muscle every 3 (three) months. 1 mL 3   metroNIDAZOLE  (METROGEL ) 0.75 % vaginal gel Place 1 Applicatorful vaginally at bedtime. 70 g 0   polyethylene glycol powder (GLYCOLAX /MIRALAX ) 17 GM/SCOOP powder Take 17 g by mouth daily. 3350 g 1   SUMAtriptan  (IMITREX ) 50 MG tablet Take 1 tablet (50 mg total) by mouth every 2 (two) hours as needed for migraine. May repeat in 2 hours if headache persists or recurs. 10 tablet 3   topiramate (TOPAMAX) 25 MG tablet Take 25 mg by mouth daily.     No current facility-administered medications for this visit.   Allergies: Allergies  Allergen Reactions   Banana Itching   Trazodone  And Nefazodone Itching   I reviewed her past medical history, social history, family history, and environmental history and no significant changes have been reported from her previous visit.  Review of Systems  Constitutional:  Negative for appetite change, chills, fever and unexpected weight change.  HENT:  Positive for congestion. Negative for rhinorrhea.   Eyes:  Negative for itching.  Respiratory:  Positive for chest tightness and shortness of breath. Negative for cough and wheezing.   Cardiovascular:  Negative for chest pain.  Gastrointestinal:  Negative for abdominal pain.  Genitourinary:  Negative for difficulty urinating.  Skin:  Negative for rash.  Allergic/Immunologic: Positive for environmental allergies and food allergies.  Neurological:  Negative for headaches.  Objective: There were no vitals taken for this visit. There is no height or weight on file to calculate BMI. Physical Exam Vitals and nursing note reviewed.  Constitutional:      Appearance: She is well-developed.  HENT:     Head: Normocephalic and atraumatic.     Right Ear: Tympanic membrane and external ear  normal.     Left Ear: Tympanic membrane and external ear normal.     Nose: Nose normal.     Mouth/Throat:     Mouth: Mucous membranes are moist.     Pharynx: Oropharynx is clear.  Eyes:     Conjunctiva/sclera: Conjunctivae normal.  Cardiovascular:     Rate and Rhythm: Normal rate and regular rhythm.     Heart sounds: Normal heart sounds. No murmur heard.    No friction rub. No gallop.  Pulmonary:     Effort: Pulmonary effort is normal.     Breath sounds: Normal breath sounds. No wheezing, rhonchi or rales.  Musculoskeletal:     Cervical back: Neck supple.  Skin:    General: Skin is warm.     Findings: No rash.  Neurological:     Mental Status: She is alert and oriented to person, place, and time.    Previous notes and tests were reviewed. The plan was reviewed with the patient/family, and all questions/concerned were addressed.  It was my pleasure to see Pamela Norris today and participate in her care. Please feel free to contact me with any questions or concerns.  Sincerely,  Orlan Cramp, DO Allergy  & Immunology  [facility]  [place] office: [phone] [facility] office: [phone]

## 2024-06-24 NOTE — Telephone Encounter (Signed)
 Pharmacy Patient Advocate Encounter  Received notification from [facility] that Prior Authorization for Fexofenadine  180mg   has been APPROVED from 06/23/2024 to 06/23/2025

## 2024-07-08 ENCOUNTER — Encounter: Payer: MEDICAID | Admitting: Family Medicine

## 2024-07-11 ENCOUNTER — Encounter: Payer: Self-pay | Admitting: Family Medicine

## 2024-08-29 ENCOUNTER — Ambulatory Visit: Payer: MEDICAID | Admitting: Internal Medicine

## 2024-08-29 ENCOUNTER — Encounter: Payer: Self-pay | Admitting: Internal Medicine

## 2024-08-29 ENCOUNTER — Other Ambulatory Visit: Payer: Self-pay

## 2024-08-29 VITALS — BP 110/80 | HR 96 | Temp 98.7°F | Resp 18 | Ht 68.0 in | Wt 170.6 lb

## 2024-08-29 DIAGNOSIS — H1010 Acute atopic conjunctivitis, unspecified eye: Secondary | ICD-10-CM

## 2024-08-29 DIAGNOSIS — L708 Other acne: Secondary | ICD-10-CM | POA: Diagnosis not present

## 2024-08-29 DIAGNOSIS — J4520 Mild intermittent asthma, uncomplicated: Secondary | ICD-10-CM | POA: Diagnosis not present

## 2024-08-29 DIAGNOSIS — L739 Follicular disorder, unspecified: Secondary | ICD-10-CM

## 2024-08-29 DIAGNOSIS — J302 Other seasonal allergic rhinitis: Secondary | ICD-10-CM

## 2024-08-29 DIAGNOSIS — Z91018 Allergy to other foods: Secondary | ICD-10-CM | POA: Diagnosis not present

## 2024-08-29 DIAGNOSIS — J3089 Other allergic rhinitis: Secondary | ICD-10-CM

## 2024-08-29 MED ORDER — CETIRIZINE HCL 10 MG PO TABS: 10.0000 mg | ORAL_TABLET | Freq: Two times a day (BID) | ORAL | 5 refills | Status: AC | PRN

## 2024-08-29 MED ORDER — TRIAMCINOLONE ACETONIDE 0.1 % EX OINT: TOPICAL_OINTMENT | CUTANEOUS | 0 refills | Status: AC

## 2024-08-29 NOTE — Progress Notes (Addendum)
 FOLLOW UP Date of Service/Encounter:  08/29/2024   Subjective:  Pamela Norris (DOB: 2001-08-09) is a 23 y.o. female who [facility] for follow up for an acute visit.   History obtained from: chart review and patient. At last visit with Dr Luke, discussed her breathing symptoms maybe related to anxiety/panic attacks rather than uncontrolled asthma, continue Symbicort /stop Spiriva. Discussed at length risks/benefits of AIT and to await neurology workup prior to starting shots and continue Xyzal /Allegra  until then.  Avoiding bananas; although clinical symptoms are inconsistent.  Notes having rashes on her back that stared 3 weeks ago.  It is small, rough, bumpy and itchy.  Was seen in urgent care/ED and treated for bed bugs/fleas but no improvement.  Tried benadryl  without improvement.  No changes in medications or products  No history of eczema/hives Also notes a facial rash.  Notes trouble with shortness of breath with activity just going uphill. Has not noted any improvement with Symbicort  or Albuterol  so no longer using. No ER/urgent care visits for asthma. No oral prednisone.   Avoiding banana.  Also does have trouble with chronic allergies with runny nose, congestion, sneezing.  Previously on Allegra /Xyzal  but not using anymore.  Still interested in allergy  shots.  She is undergoing workup with Cardiology for abnormal EKG/precordial pain/SOB/palpitations.    Past Medical History: Past Medical History:  Diagnosis Date   ADHD (attention deficit hyperactivity disorder)    Anxiety    Asthma    Eustachian tube dysfunction 07/2010   Headache    OCD (obsessive compulsive disorder)    mild    ODD (oppositional defiant disorder)    some     Objective:  BP 110/80 (BP Location: Left Arm, Patient Position: Sitting, Cuff Size: Normal)   Pulse 96   Temp 98.7 F (37.1 C) (Temporal)   Resp 18   Ht 5' 8 (1.727 m)   Wt 170 lb 9.6 oz (77.4 kg)    SpO2 95%   BMI 25.94 kg/m  Body mass index is 25.94 kg/m. Physical Exam: GEN: alert, well developed HEENT: clear conjunctiva, nose with mild inferior turbinate hypertrophy, pink nasal mucosa, clear rhinorrhea, + cobblestoning HEART: regular rate and rhythm, no murmur LUNGS: clear to auscultation bilaterally, no coughing, unlabored respiration SKIN: few whiteheads on back     Assessment:   1. Folliculitis   2. Other acne   3. Seasonal and perennial allergic rhinoconjunctivitis   4. Food allergy    5. Mild intermittent asthma without complication     Plan/Recommendations:  Rashes - Possibly folliculitis on back. - Wear loose clothing.   - Please use chlorhexidine gluconate cleanser from over the counter.   - Apply prescribed topical steroid (triamcinolone  0.1% below neck) to flared areas after the moisturizer has soaked into the skin (wait at least 30 minutes). Taper off the topical steroids as the skin improves. Do not use topical steroid for more than 10 days. - Use Zyrtec  10mg  twice daily as needed for itching.   - If no improvement, please let us  know and we will put in a referral to Dermatology.    Acne- Face - Use over the counter benzoyl peroxide cream and salicylic acid wash.    Mild Intermittent Asthma: - discussed some symptoms are likely due to exercise intolerance rather than asthma.  Also could have some level of VCD, discussed breathing exercises.  May use albuterol  rescue inhaler 2 puffs every 4 to 6 hours as needed for  shortness of breath, chest tightness, coughing, and wheezing. May use albuterol  rescue inhaler 2 puffs 5 to 15 minutes prior to strenuous physical activities. Monitor frequency of use - if you need to use it more than twice per week on a consistent basis let us  know.  Breathing control goals:  Full participation in all desired activities (may need albuterol  before activity) Albuterol  use two times or less a week on average (not counting use with  activity) Cough interfering with sleep two times or less a month Oral steroids no more than once a year No hospitalizations   Allergic Rhinitis - Being worked up by Cardiology and Neurology; can consider starting AIT in the future depending on results.   Continue allergen avoidance measures directed toward grass pollen, weed pollen, tree pollen, indoor mold, and outdoor molds Take Zyrtec  10mg  daily as needed.  Nasal saline spray (i.e., Simply Saline) or nasal saline lavage (i.e., NeilMed) is recommended as needed and prior to medicated nasal sprays.  Food Allergy  Continue to avoid bananas. For mild symptoms you can take over the counter antihistamines (zyrtec  10mg  to 20mg ) and monitor symptoms closely.  If symptoms worsen or if you have severe symptoms including breathing issues, throat closure, significant swelling, whole body hives, severe diarrhea and vomiting, lightheadedness then use epinephrine  and seek immediate medical care afterwards. Emergency action plan given.   Return in about 3 months (around 11/27/2024).  Arleta Blanch, MD [facility] 

## 2024-08-29 NOTE — Patient Instructions (Addendum)
 Rashes - Wear loose clothing.   - Please use chlorhexidine gluconate cleanser from over the counter.   - Apply prescribed topical steroid (triamcinolone  0.1% below neck) to flared areas after the moisturizer has soaked into the skin (wait at least 30 minutes). Taper off the topical steroids as the skin improves. Do not use topical steroid for more than 10 days. - Use Zyrtec  10mg  twice daily as needed for itching.   - If no improvement, please let us  know and we will put in a referral to Dermatology.    Acne- Face - Use over the counter benzoyl peroxide cream and salicylic acid wash.    Breathing May use albuterol  rescue inhaler 2 puffs every 4 to 6 hours as needed for shortness of breath, chest tightness, coughing, and wheezing. May use albuterol  rescue inhaler 2 puffs 5 to 15 minutes prior to strenuous physical activities. Monitor frequency of use - if you need to use it more than twice per week on a consistent basis let us  know.  Breathing control goals:  Full participation in all desired activities (may need albuterol  before activity) Albuterol  use two times or less a week on average (not counting use with activity) Cough interfering with sleep two times or less a month Oral steroids no more than once a year No hospitalizations   Environmental allergies Continue allergen avoidance measures directed toward grass pollen, weed pollen, tree pollen, indoor mold, and outdoor molds Take Zyrtec  10mg  daily as needed.  Nasal saline spray (i.e., Simply Saline) or nasal saline lavage (i.e., NeilMed) is recommended as needed and prior to medicated nasal sprays.  Food Continue to avoid bananas in all form for now. For mild symptoms you can take over the counter antihistamines (zyrtec  10mg  to 20mg ) and monitor symptoms closely.  If symptoms worsen or if you have severe symptoms including breathing issues, throat closure, significant swelling, whole body hives, severe diarrhea and vomiting,  lightheadedness then use epinephrine  and seek immediate medical care afterwards.

## 2024-09-03 ENCOUNTER — Other Ambulatory Visit: Payer: Self-pay

## 2024-09-03 ENCOUNTER — Encounter (HOSPITAL_COMMUNITY): Payer: Self-pay

## 2024-09-03 ENCOUNTER — Emergency Department (HOSPITAL_COMMUNITY)
Admission: EM | Admit: 2024-09-03 | Discharge: 2024-09-03 | Disposition: A | Discharge status: Home / Self Care | Payer: MEDICAID | Attending: Emergency Medicine | Admitting: Emergency Medicine

## 2024-09-03 DIAGNOSIS — M791 Myalgia, unspecified site: Secondary | ICD-10-CM | POA: Insufficient documentation

## 2024-09-03 DIAGNOSIS — R059 Cough, unspecified: Secondary | ICD-10-CM | POA: Diagnosis not present

## 2024-09-03 DIAGNOSIS — H6692 Otitis media, unspecified, left ear: Secondary | ICD-10-CM

## 2024-09-03 DIAGNOSIS — H9202 Otalgia, left ear: Secondary | ICD-10-CM | POA: Insufficient documentation

## 2024-09-03 DIAGNOSIS — J111 Influenza due to unidentified influenza virus with other respiratory manifestations: Secondary | ICD-10-CM

## 2024-09-03 DIAGNOSIS — R0981 Nasal congestion: Secondary | ICD-10-CM | POA: Insufficient documentation

## 2024-09-03 LAB — RESP PANEL BY RT-PCR (RSV, FLU A&B, COVID)  RVPGX2
Influenza A by PCR: NEGATIVE
Influenza B by PCR: NEGATIVE
Resp Syncytial Virus by PCR: NEGATIVE
SARS Coronavirus 2 by RT PCR: NEGATIVE

## 2024-09-03 MED ORDER — AMOXICILLIN 250 MG/5ML PO SUSR: 500.0000 mg | Freq: Three times a day (TID) | ORAL | 0 refills | Status: AC

## 2024-09-03 MED ORDER — IBUPROFEN 200 MG/10ML PO SUSP: 30.0000 mL | Freq: Three times a day (TID) | ORAL | 0 refills | Status: AC

## 2024-09-03 MED ORDER — BENZONATATE 200 MG PO CAPS: 200.0000 mg | ORAL_CAPSULE | Freq: Three times a day (TID) | ORAL | 0 refills | Status: AC | PRN

## 2024-09-03 NOTE — ED Notes (Signed)
 Pt/family received d/c paperwork at this time. After going over the paperwork any questions, comments, or concerns were answered to the best of this nurse's knowledge. The pt/family verbally acknowledged the teachings/instructions.

## 2024-09-03 NOTE — ED Triage Notes (Signed)
 Pt with runny nose, cough and ear pain.

## 2024-09-03 NOTE — Discharge Instructions (Signed)
 Take the antibiotic as directed until finished.  Drink plenty of fluids.  Tylenol  every 4 hours if needed for fever.  Follow up with your primary provider for recheck if needed

## 2024-09-03 NOTE — ED Provider Notes (Signed)
 " [facility] Provider Note   CSN: [id number] Arrival date & time: 09/03/24  1851     Patient presents with: flu like symptoms   Pamela Norris is a 23 y.o. female.   HPI      Pamela Norris is a 23 y.o. female who presents to the Emergency Department complaining of left ear pain, nasal congestion, cough and generalized bodyaches.  Symptoms began upon waking today.  No reported fever.  She does endorse decreased appetite and diarrhea.  No black or bloody stools.  No abdominal pain or vomiting.  Other family members with similar symptoms.  She noticed foul-smelling drainage from her left ear today.  Denies any ear trauma.  No fever or dysuria symptoms.  No chest pain or shortness of breath   Prior to Admission medications  Medication Sig Start Date End Date Taking? Authorizing Provider  albuterol  (VENTOLIN  HFA) 108 (90 Base) MCG/ACT inhaler Inhale 1 puff into the lungs every 6 (six) hours as needed for wheezing or shortness of breath. 02/04/24   Ambs, Arlean CHRISTELLA, FNP  atomoxetine  (STRATTERA ) 60 MG capsule Take 40 mg by mouth daily.    [provider]  budesonide -formoterol  (SYMBICORT ) 160-4.5 MCG/ACT inhaler Inhale 2 puffs into the lungs 2 (two) times daily. 02/04/24   Cari Arlean CHRISTELLA, FNP  cetirizine  (ZYRTEC  ALLERGY ) 10 MG tablet Take 1 tablet (10 mg total) by mouth 2 (two) times daily as needed for allergies (or itching). 08/29/24   Tobie Arleta SQUIBB, MD  fexofenadine  (ALLEGRA  ALLERGY ) 180 MG tablet Take 1 tablet (180 mg total) by mouth in the morning. For allergies 03/24/24   Luke Orlan CHRISTELLA, DO  ibuprofen  (ADVIL ) 800 MG tablet Take by mouth. 08/23/23   [provider]  levocetirizine (XYZAL ) 5 MG tablet Take 1 tablet (5 mg total) by mouth every evening. 03/24/24   Luke Orlan CHRISTELLA, DO  medroxyPROGESTERone  (DEPO-PROVERA ) 150 MG/ML injection Inject 1 mL (150 mg total) into the muscle every 3 (three) months. 09/04/23   Gladis Mary-Margaret,  FNP  metroNIDAZOLE  (METROGEL ) 0.75 % vaginal gel Place 1 Applicatorful vaginally at bedtime. 04/30/24   Gladis Elsie BROCKS, PA-C  polyethylene glycol powder (GLYCOLAX /MIRALAX ) 17 GM/SCOOP powder Take 17 g by mouth daily. 02/13/24   Fleming, Zelda W, NP  SUMAtriptan  (IMITREX ) 50 MG tablet Take 1 tablet (50 mg total) by mouth every 2 (two) hours as needed for migraine. May repeat in 2 hours if headache persists or recurs. 05/17/24   Camara, Amadou, MD  topiramate (TOPAMAX) 25 MG tablet Take 25 mg by mouth daily.    [provider]  triamcinolone  ointment (KENALOG ) 0.1 % Apply twice daily for flare ups below neck, maximum 10 days. 08/29/24   Tobie Arleta SQUIBB, MD    Allergies: Banana and Trazodone  and nefazodone    Review of Systems  HENT:  Positive for congestion, ear discharge, ear pain and rhinorrhea.   Respiratory:  Positive for cough.   Gastrointestinal:  Positive for diarrhea.  Musculoskeletal:  Positive for myalgias.  All other systems reviewed and are negative.   Updated Vital Signs BP 120/79   Pulse 88   Temp 98.6 F (37 C)   Resp 16   Wt 77.4 kg   SpO2 97%   BMI 25.94 kg/m   Physical Exam Vitals and nursing note reviewed.  Constitutional:      General: She is not in acute distress.    Appearance: Normal appearance. She is not toxic-appearing.  HENT:     Right Ear: Tympanic membrane and ear canal normal.     Left Ear: Drainage and tenderness present. No swelling. No mastoid tenderness. No hemotympanum. Tympanic membrane is erythematous. Tympanic membrane is not bulging.     Ears:     Comments: Crusty drainage to left ear canal.  No edema.  Left TM erythematous, no bulging or perforation.  Mastoid nontender    Nose: Congestion present.     Mouth/Throat:     Mouth: Mucous membranes are moist.     Pharynx: Oropharynx is clear. No oropharyngeal exudate or posterior oropharyngeal erythema.  Eyes:     Conjunctiva/sclera: Conjunctivae normal.  Cardiovascular:     Rate  and Rhythm: Normal rate and regular rhythm.     Pulses: Normal pulses.  Pulmonary:     Effort: Pulmonary effort is normal. No respiratory distress.     Breath sounds: No wheezing.  Abdominal:     General: There is no distension.     Palpations: Abdomen is soft.     Tenderness: There is no abdominal tenderness.  Neurological:     Mental Status: She is alert.     (all labs ordered are listed, but only abnormal results are displayed) Labs Reviewed  RESP PANEL BY RT-PCR (RSV, FLU A&B, COVID)  [id number]    EKG: None  Radiology: No results found.   Procedures   Medications Ordered in the ED - No data to display                                  Medical Decision Making   Patient here with flulike illness.  On exam she has left otitis media.  No perforation.  There is some crusted drainage of the ear canal as well.  There is no erythema of the canal to suggest otitis externa. Vital signs reassuring.  She is nontoxic-appearing.  Suspect viral process.  She does have a left otitis media and will treat with antibiotics.  Agreeable to symptomatic treatment for likely viral process.SABRA  Appropriate for discharge home, tachycardic upon arrival but heart rate improved on recheck.  Amount and/or Complexity of Data Reviewed Labs: ordered.    Details: Respiratory panel negative  Risk Prescription drug management.        Final diagnoses:  Left otitis media, unspecified otitis media type  Influenza-like illness    ED Discharge Orders     None          Herlinda Milling, PA-C 09/03/24 2101  "

## 2024-09-29 ENCOUNTER — Ambulatory Visit: Payer: MEDICAID | Admitting: Podiatry

## 2024-10-04 ENCOUNTER — Ambulatory Visit: Payer: MEDICAID | Admitting: Podiatry

## 2024-10-17 NOTE — ED Provider Notes (Addendum)
 Emergency Department Provider Note    ED Clinical Impression   Final diagnoses:  Otitis media, unspecified laterality, unspecified otitis media type (Primary)  Otalgia, unspecified laterality  Chest pain, unspecified type  Otitis externa, unspecified chronicity, unspecified laterality, unspecified type    ED Assessment/Plan    History   Chief Complaint  Patient presents with   Ear Pain    Left ear pain since December.  Has taken antibiotics and ear drops.  Ear continues to drain.    HPI  1850 hrs. Patient is a 24 year old with complaints of left ear pain for 5 days not responding to eardrops and intermittent chest pain problems feeling like something stuck in her throat by exam awake alert cooperative cardiorespiratory general neuroexam negative abdomen benign. PERC criteria negative she has external otitis we will consider her for dose of trial of steroids she has allergy  problems suspicious for eustachian tube issues  Past Medical History[1]  Past Surgical History[2]  Family History[3]  Social History[4]  Review of Systems  HENT:  Positive for ear pain.   Respiratory:  Negative for chest tightness and shortness of breath.   Cardiovascular:  Positive for chest pain.  All other systems reviewed and are negative.   Physical Exam   BP 145/73   Pulse 72   Temp 36.8 C (98.2 F) (Oral)   Resp 16   SpO2 99%   Physical Exam  Vital signs have been reviewed. Patient is well-appearing w/o respiratory distress shock or major trauma. HEENT is atraumatic.  Neck shows unimpaired range of motion. Chest No increased work of breathing or audible wheezing. Cardiac Good perfusion throughout. Abdomen is not distended. Extremities are without significant trauma. Skin no visualized rash. Neuro exam is grossly non-focal. Psych exam shows normal mood and behavior.  ED Course    Possible otitis media or eustachian tube problems will give antibiotics and  steroid   Medical Decision Making Amount and/or Complexity of Data Reviewed Labs: ordered. Radiology: ordered. ECG/medicine tests: ordered.  Risk OTC drugs. Prescription drug management.  2020 hrs. Workup essentially is negative at this time we will go ahead and treat patient for the ear related symptoms with course of steroid for eustachian tube dysfunction in setting of allergies cardiac chest pain evaluation remains low risk with PERC criteria in low risk category low.  Low suspicion for malignant otitis no history of diabetes no presence of purulent or foul-smelling drainage from the ear            Dallara, Norleen Agent, MD 10/17/24 1850       [1] Past Medical History: Diagnosis Date   ADHD    ADHD    Bipolar 1 disorder    (CMS-HCC)    Defiant behavior    Mood disorder   [2] Past Surgical History: Procedure Laterality Date   tubes in ears Bilateral   [3] Family History Problem Relation Age of Onset   Cancer Maternal Grandmother   [4] Social History Socioeconomic History   Marital status: Single    Spouse name: None   Number of children: None   Years of education: None   Highest education level: None  Tobacco Use   Smoking status: Never    Passive exposure: Never   Smokeless tobacco: Never  Vaping Use   Vaping status: Former   Substances: Nicotine , Flavoring  Substance and Sexual Activity   Alcohol use: Never   Drug use: Never   Sexual activity: Yes    Partners: Male  Social History  Narrative   ** Merged History Encounter **       Social Drivers of Health   Food Insecurity: No Food Insecurity (06/24/2024)   Hunger Vital Sign    Worried About Running Out of Food in the Last Year: Never true    Ran Out of Food in the Last Year: Never true  Recent Concern: Food Insecurity - Food Insecurity Present (05/23/2024)   Received from [facility]   Hunger Vital Sign    Within the past 12 months, you worried that your food would  run out before you got the money to buy more.: Sometimes true    Within the past 12 months, the food you bought just didn't last and you didn't have money to get more.: Sometimes true  Tobacco Use: Low Risk (10/17/2024)   Patient History    Smoking Tobacco Use: Never    Smokeless Tobacco Use: Never    Passive Exposure: Never  Transportation Needs: No Transportation Needs (06/24/2024)   PRAPARE - Administrator, Civil Service (Medical): No    Lack of Transportation (Non-Medical): No  Alcohol Use: Not At Risk (05/23/2024)   Received from [facility]   AUDIT-C    Q1: How often do you have a drink containing alcohol?: Never  Housing: Low Risk (06/24/2024)   Housing    Within the past 12 months, have you ever stayed: outside, in a car, in a tent, in an overnight shelter, or temporarily in someone else's home (i.e. couch-surfing)?: No    Are you worried about losing your housing?: No  Recent Concern: Housing - High Risk (05/23/2024)   Received from [facility] Stability Vital Sign    In the last 12 months, was there a time when you were not able to pay the mortgage or rent on time?: Patient declined    In the past 12 months, how many times have you moved where you were living?: 8    At any time in the past 12 months, were you homeless or living in a shelter (including now)?: Yes  Physical Activity: Sufficiently Active (05/23/2024)   Received from [facility]   Exercise Vital Sign    On average, how many days per week do you engage in moderate to strenuous exercise (like a brisk walk)?: 7 days    On average, how many minutes do you engage in exercise at this level?: 100 min  Utilities: Low Risk (06/24/2024)   Utilities    Within the past 12 months, have you been unable to get utilities (heat, electricity) when it was really needed?: No  Stress: Stress Concern Present (05/23/2024)   Received from [facility] -  Occupational Stress Questionnaire    Do you feel stress - tense, restless, nervous, or anxious, or unable to sleep at night because your mind is troubled all the time - these days?: Very much  Interpersonal Safety: Not At Risk (07/27/2024)   Interpersonal Safety    Unsafe Where You Currently Live: No    Physically Hurt by Anyone: No    Abused by Anyone: No  Substance Use: Low Risk (11/12/2023)   Substance Use    In the past year, how often have you used prescription drugs for non-medical reasons?: Never    In the past year, how often have you used illegal drugs?: Never    In the past year, have you used any substance for non-medical reasons?: No  Social Connections: Socially Integrated (05/23/2024)   Received from [facility]   Social Network    How would you rate your social network (family, work, friends)?: Good participation with social networks  Financial Resource Strain: Medium Risk (05/23/2024)   Received from [facility]   Overall Financial Resource Strain (CARDIA)    How hard is it for you to pay for the very basics like food, housing, medical care, and heating?: Somewhat hard  Health Literacy: Low Risk (11/12/2023)   Health Literacy    : Never   Dallara, Norleen Agent, MD 10/17/24 2233

## 2024-10-21 ENCOUNTER — Ambulatory Visit: Payer: MEDICAID

## 2024-10-25 ENCOUNTER — Institutional Professional Consult (permissible substitution) (INDEPENDENT_AMBULATORY_CARE_PROVIDER_SITE_OTHER): Payer: MEDICAID | Admitting: Otolaryngology

## 2024-11-28 ENCOUNTER — Ambulatory Visit: Payer: MEDICAID | Admitting: Family Medicine

## 2024-12-29 ENCOUNTER — Ambulatory Visit: Payer: MEDICAID | Admitting: Neurology
